# Patient Record
Sex: Male | Born: 1950 | Race: White | Hispanic: No | Smoking: Former smoker
Health system: Southern US, Community
[De-identification: ages and names within clinical notes are randomized; demographics above are authoritative.]

## PROBLEM LIST (undated history)

## (undated) DIAGNOSIS — I10 Essential (primary) hypertension: Secondary | ICD-10-CM

## (undated) DIAGNOSIS — M1990 Unspecified osteoarthritis, unspecified site: Secondary | ICD-10-CM

## (undated) DIAGNOSIS — G8929 Other chronic pain: Secondary | ICD-10-CM

## (undated) HISTORY — PX: BACK SURGERY: SHX140

## (undated) HISTORY — PX: OTHER SURGICAL HISTORY: SHX169

---

## 2004-06-15 ENCOUNTER — Other Ambulatory Visit: Payer: Self-pay

## 2004-06-25 ENCOUNTER — Ambulatory Visit: Payer: Self-pay | Admitting: Psychiatry

## 2005-07-17 ENCOUNTER — Ambulatory Visit: Payer: Self-pay

## 2005-07-17 ENCOUNTER — Emergency Department: Payer: Self-pay | Admitting: Emergency Medicine

## 2005-08-25 ENCOUNTER — Ambulatory Visit: Payer: Self-pay | Admitting: Family Medicine

## 2005-09-01 ENCOUNTER — Other Ambulatory Visit: Payer: Self-pay

## 2005-09-01 ENCOUNTER — Ambulatory Visit: Payer: Self-pay | Admitting: Internal Medicine

## 2005-09-22 ENCOUNTER — Encounter: Payer: Self-pay | Admitting: Internal Medicine

## 2005-09-25 ENCOUNTER — Encounter: Payer: Self-pay | Admitting: Internal Medicine

## 2005-10-26 ENCOUNTER — Encounter: Payer: Self-pay | Admitting: Internal Medicine

## 2005-11-23 ENCOUNTER — Encounter: Payer: Self-pay | Admitting: Internal Medicine

## 2005-12-18 IMAGING — CT CT CHEST W/ CM
1 series · 15 of 33 positions shown, 19 images · IV contrast (APPLIED)
Comparison: none

REASON FOR EXAM: Chest pain, diabetic, not Metformin  Call Report [id number]
COMMENTS:

[Series 6: soft tissue · axial · 0.79mm/px · z∈[-337,-61]mm · 15 of 108 slices shown, 19 images]
[im 8/108  mediastinal]
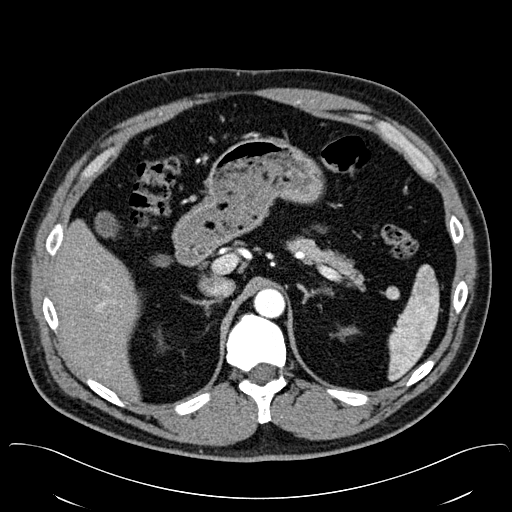
[im 8/108  lung]
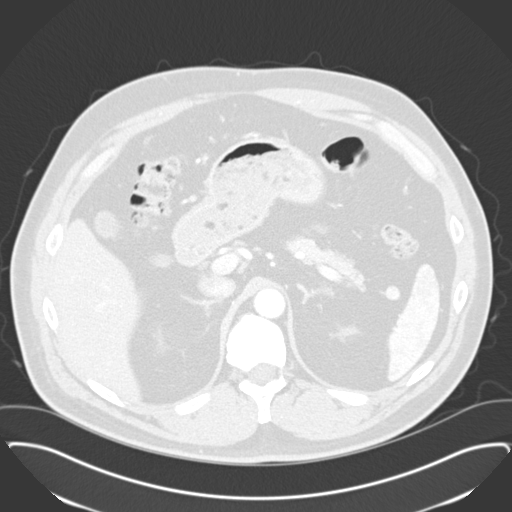
[im 16/108  lung]
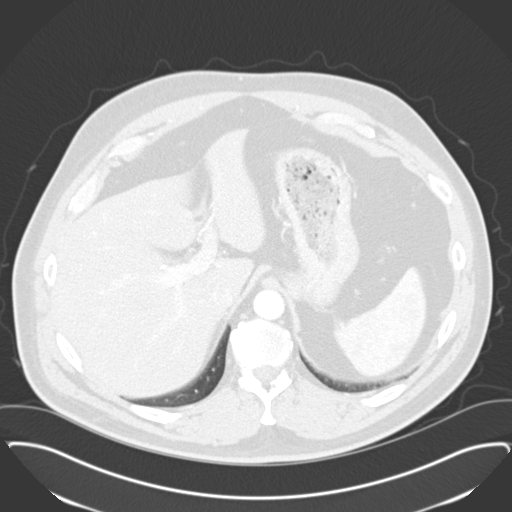
[im 22/108  lung]
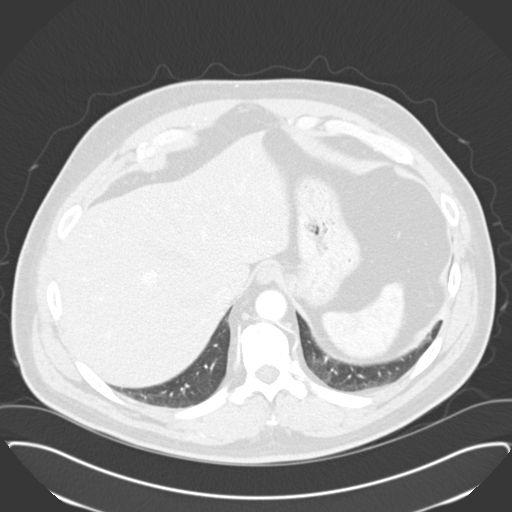
[im 28/108  lung]
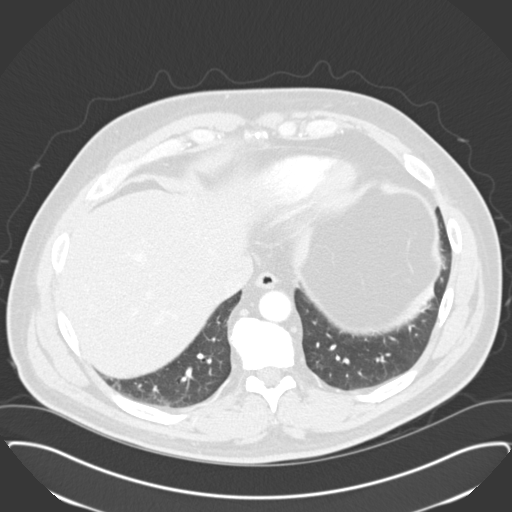
[im 36/108  mediastinal]
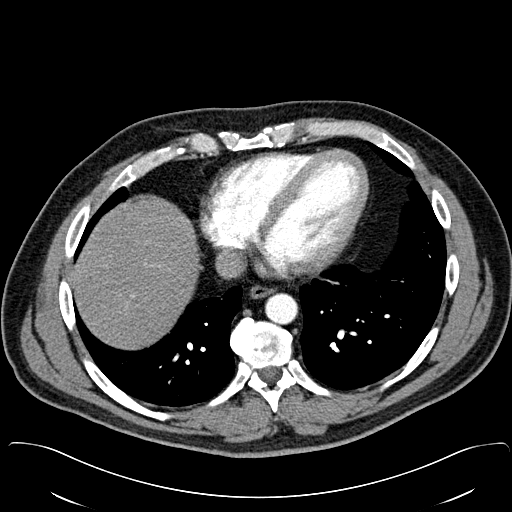
[im 36/108  lung]
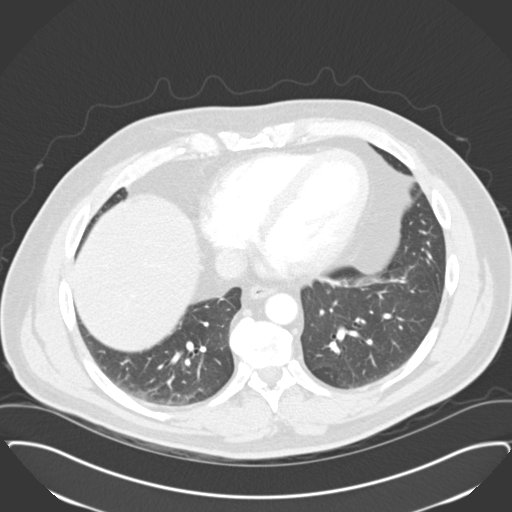
[im 43/108  lung]
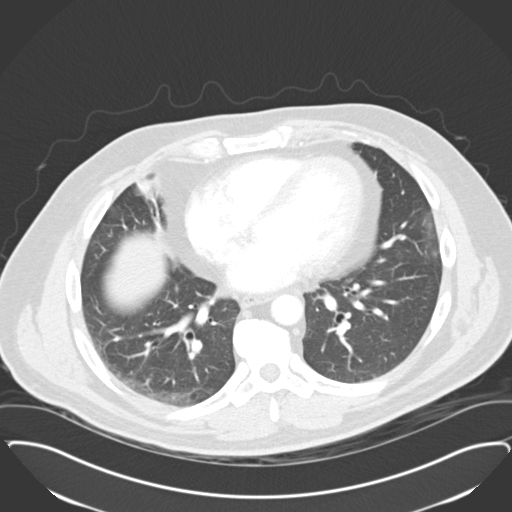
[im 48/108  lung]
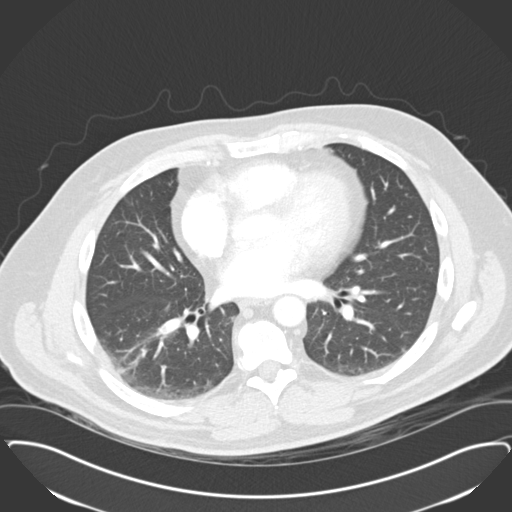
[im 56/108  lung]
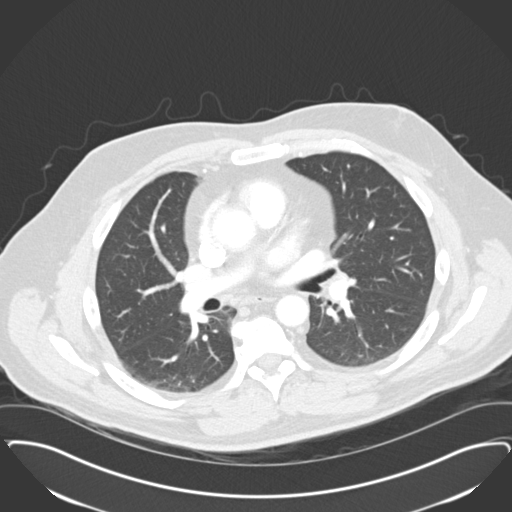
[im 60/108  mediastinal]
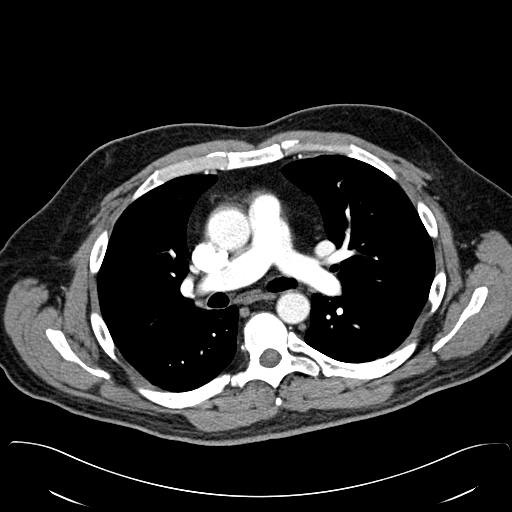
[im 60/108  lung]
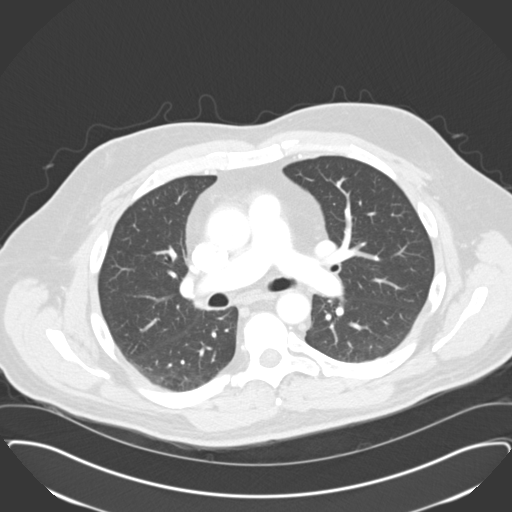
[im 65/108  lung]
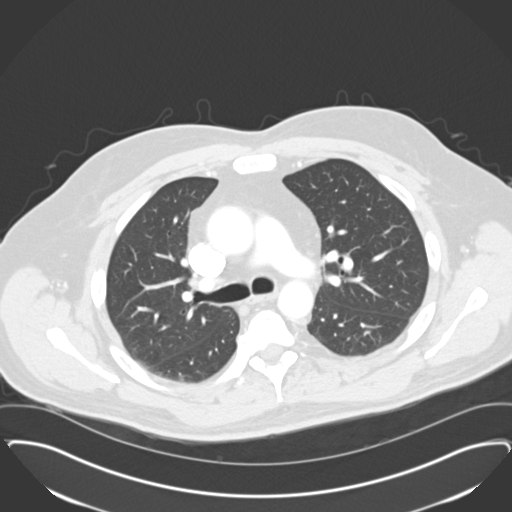
[im 72/108  lung]
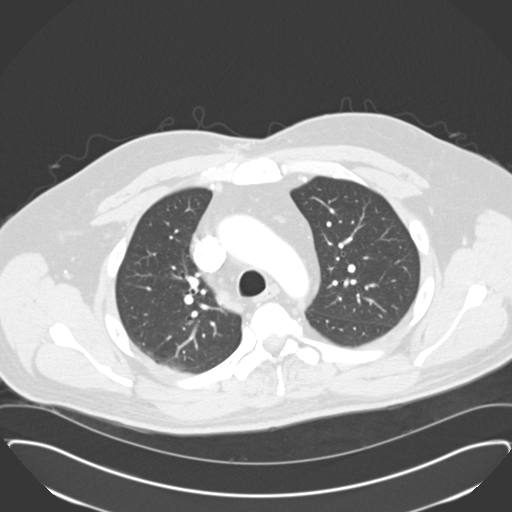
[im 80/108  lung]
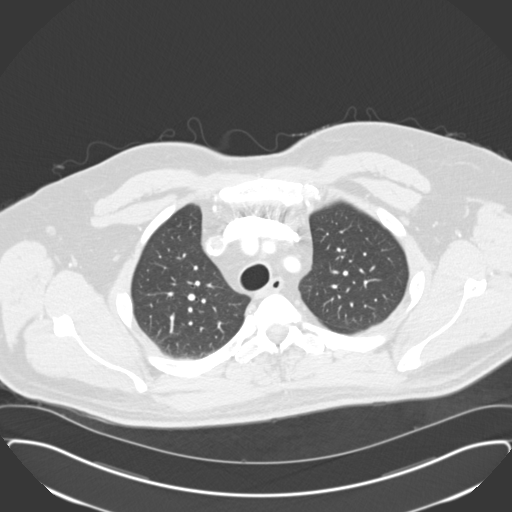
[im 86/108  mediastinal]
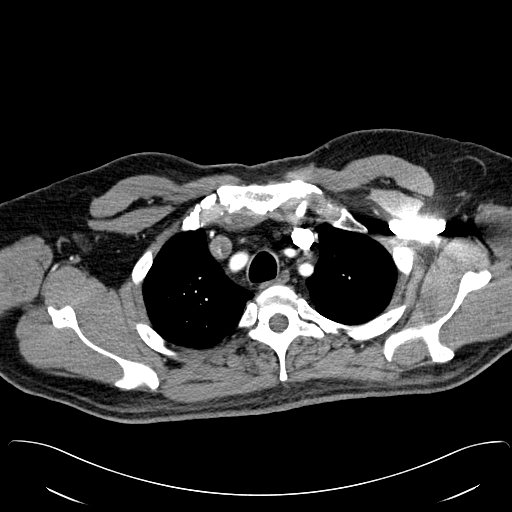
[im 86/108  lung]
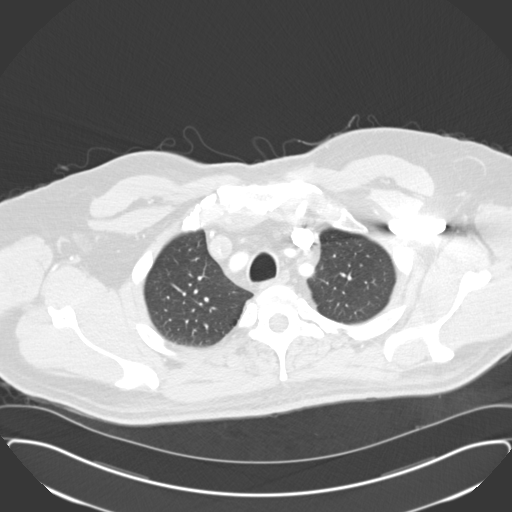
[im 92/108  lung]
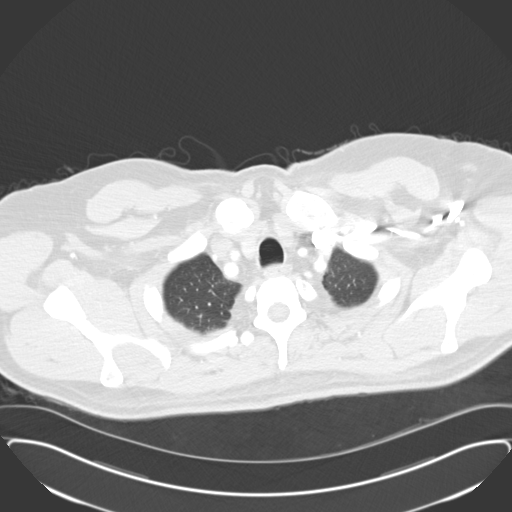
[im 100/108  lung]
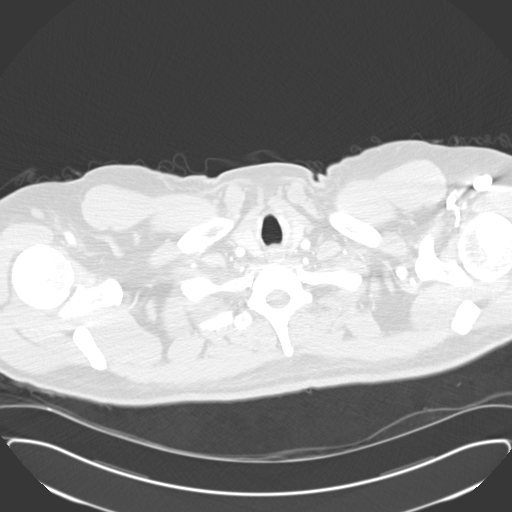

[15 of 33 positions shown; findings below may reference images not displayed]

PROCEDURE:     CT  - CT CHEST WITH CONTRAST  - August 25, 2005  [DATE]

RESULT:          Standard IV contrast-enhanced chest CT is obtained.

The mediastinum is unremarkable.  The thoracic aorta is unremarkable.  The
pulmonary arteries are clear with no evidence of pulmonary embolus.  The
adrenals are normal.  The large airways are patent.  Pleural parenchymal
thickening is noted.  No focal pulmonary abnormalities are identified.  No
evidence of infiltrate is noted.  Minimal interstitial prominence and
atelectasis is noted.  Noted is what appears to be a very tiny filling
defect in a segmental branch of the pulmonary artery in the RIGHT lower
lobe.  This region was reviewed with multiplanar reconstructions in both the
sagittal and coronal planes as well as volume-rendered images, and this
abnormality is revealed to represent volume averaging with adjacent lung.
The images just superior and inferior to this finding are normal, indicating
that, along with the normal multiplanar reconstructions and volume-rendered
images, this finding is not a pulmonary embolus.  It may be wise to repeat a
CT of the chest if the patient has persistent symptoms of pulmonary embolic
disease or has deep venous thrombus.
IMPRESSION: Mild basilar atelectasis and interstitial prominence,
otherwise negative exam.  See discussion above.

## 2008-02-10 ENCOUNTER — Emergency Department: Payer: Self-pay | Admitting: Internal Medicine

## 2008-05-05 ENCOUNTER — Other Ambulatory Visit: Payer: Self-pay

## 2008-05-05 ENCOUNTER — Ambulatory Visit: Payer: Self-pay | Admitting: Unknown Physician Specialty

## 2008-05-25 ENCOUNTER — Inpatient Hospital Stay: Payer: Self-pay | Admitting: Unknown Physician Specialty

## 2008-05-27 ENCOUNTER — Other Ambulatory Visit: Payer: Self-pay

## 2008-07-17 ENCOUNTER — Emergency Department: Payer: Self-pay | Admitting: Emergency Medicine

## 2008-12-01 ENCOUNTER — Emergency Department: Payer: Self-pay | Admitting: Emergency Medicine

## 2010-01-29 ENCOUNTER — Emergency Department: Payer: Self-pay | Admitting: Emergency Medicine

## 2010-09-12 ENCOUNTER — Ambulatory Visit: Payer: Self-pay | Admitting: Pain Medicine

## 2012-05-05 ENCOUNTER — Emergency Department: Payer: Self-pay | Admitting: Emergency Medicine

## 2012-05-05 LAB — COMPREHENSIVE METABOLIC PANEL
Alkaline Phosphatase: 118 U/L (ref 50–136)
Anion Gap: 9 (ref 7–16)
BUN: 21 mg/dL — ABNORMAL HIGH (ref 7–18)
Bilirubin,Total: 0.6 mg/dL (ref 0.2–1.0)
Calcium, Total: 9.8 mg/dL (ref 8.5–10.1)
Chloride: 100 mmol/L (ref 98–107)
Co2: 27 mmol/L (ref 21–32)
Creatinine: 1.09 mg/dL (ref 0.60–1.30)
EGFR (African American): >60
EGFR (Non-African Amer.): >60
Osmolality: 274 (ref 275–301)
Potassium: 3.8 mmol/L (ref 3.5–5.1)
SGPT (ALT): 20 U/L (ref 12–78)

## 2012-05-05 LAB — ETHANOL
Ethanol %: <0.003 % (ref 0.000–0.080)
Ethanol: <3 mg/dL

## 2012-05-05 LAB — URINALYSIS, COMPLETE
Bilirubin,UR: NEGATIVE
Blood: NEGATIVE
Hyaline Cast: 8
Leukocyte Esterase: NEGATIVE
Ph: 5 (ref 4.5–8.0)
RBC,UR: 1 /HPF (ref 0–5)
Specific Gravity: 1.02 (ref 1.003–1.030)
Squamous Epithelial: NONE SEEN

## 2012-05-05 LAB — DRUG SCREEN, URINE
Amphetamines, Ur Screen: NEGATIVE (ref ?–1000)
Barbiturates, Ur Screen: NEGATIVE (ref ?–200)
Benzodiazepine, Ur Scrn: NEGATIVE (ref ?–200)
Cannabinoid 50 Ng, Ur ~~LOC~~: NEGATIVE (ref ?–50)
Cocaine Metabolite,Ur ~~LOC~~: NEGATIVE (ref ?–300)
Opiate, Ur Screen: NEGATIVE (ref ?–300)
Phencyclidine (PCP) Ur S: NEGATIVE (ref ?–25)

## 2012-05-05 LAB — CBC
HGB: 15.3 g/dL (ref 13.0–18.0)
MCHC: 33.1 g/dL (ref 32.0–36.0)
RDW: 14.7 % — ABNORMAL HIGH (ref 11.5–14.5)
WBC: 11.8 10*3/uL — ABNORMAL HIGH (ref 3.8–10.6)

## 2012-05-05 LAB — TSH: Thyroid Stimulating Horm: 0.672 u[IU]/mL

## 2012-05-07 ENCOUNTER — Emergency Department: Payer: Self-pay | Admitting: Emergency Medicine

## 2012-05-07 LAB — COMPREHENSIVE METABOLIC PANEL
Albumin: 4 g/dL (ref 3.4–5.0)
Alkaline Phosphatase: 122 U/L (ref 50–136)
Anion Gap: 9 (ref 7–16)
BUN: 28 mg/dL — ABNORMAL HIGH (ref 7–18)
Calcium, Total: 9.2 mg/dL (ref 8.5–10.1)
Co2: 29 mmol/L (ref 21–32)
EGFR (African American): >60
EGFR (Non-African Amer.): 57 — ABNORMAL LOW
SGOT(AST): 56 U/L — ABNORMAL HIGH (ref 15–37)
SGPT (ALT): 20 U/L (ref 12–78)
Sodium: 136 mmol/L (ref 136–145)
Total Protein: 8.8 g/dL — ABNORMAL HIGH (ref 6.4–8.2)

## 2012-05-07 LAB — URINALYSIS, COMPLETE
Glucose,UR: 150 mg/dL (ref 0–75)
Hyaline Cast: 3
Nitrite: NEGATIVE
Ph: 5 (ref 4.5–8.0)
Protein: NEGATIVE
Specific Gravity: 1.012 (ref 1.003–1.030)

## 2012-05-07 LAB — ETHANOL
Ethanol %: <0.003 % (ref 0.000–0.080)
Ethanol: <3 mg/dL

## 2012-05-07 LAB — CBC
MCH: 29.6 pg (ref 26.0–34.0)
MCHC: 33.6 g/dL (ref 32.0–36.0)
MCV: 88 fL (ref 80–100)
Platelet: 413 10*3/uL (ref 150–440)
RBC: 5.06 10*6/uL (ref 4.40–5.90)
RDW: 15 % — ABNORMAL HIGH (ref 11.5–14.5)

## 2012-05-07 LAB — DRUG SCREEN, URINE
Amphetamines, Ur Screen: NEGATIVE (ref ?–1000)
Benzodiazepine, Ur Scrn: NEGATIVE (ref ?–200)
Cocaine Metabolite,Ur ~~LOC~~: NEGATIVE (ref ?–300)
MDMA (Ecstasy)Ur Screen: NEGATIVE (ref ?–500)
Methadone, Ur Screen: NEGATIVE (ref ?–300)
Opiate, Ur Screen: NEGATIVE (ref ?–300)
Tricyclic, Ur Screen: NEGATIVE (ref ?–1000)

## 2012-05-07 LAB — ACETAMINOPHEN LEVEL: Acetaminophen: <2 ug/mL

## 2012-05-07 LAB — SALICYLATE LEVEL: Salicylates, Serum: <1.7 mg/dL

## 2012-05-10 LAB — HEPATIC FUNCTION PANEL A (ARMC)
Bilirubin,Total: 0.5 mg/dL (ref 0.2–1.0)
SGPT (ALT): 15 U/L (ref 12–78)
Total Protein: 7.2 g/dL (ref 6.4–8.2)

## 2012-05-10 LAB — AMMONIA: Ammonia, Plasma: 42 mcmol/L — ABNORMAL HIGH (ref 11–32)

## 2012-05-11 ENCOUNTER — Emergency Department: Payer: Self-pay | Admitting: Internal Medicine

## 2012-05-11 LAB — URINALYSIS, COMPLETE
Nitrite: NEGATIVE
Ph: 7 (ref 4.5–8.0)
Protein: NEGATIVE
Specific Gravity: 1.016 (ref 1.003–1.030)

## 2012-05-13 ENCOUNTER — Emergency Department: Payer: Self-pay | Admitting: Emergency Medicine

## 2012-05-13 LAB — COMPREHENSIVE METABOLIC PANEL
Albumin: 3.5 g/dL (ref 3.4–5.0)
Calcium, Total: 9.1 mg/dL (ref 8.5–10.1)
Chloride: 101 mmol/L (ref 98–107)
Creatinine: 1.01 mg/dL (ref 0.60–1.30)
Glucose: 102 mg/dL — ABNORMAL HIGH (ref 65–99)
Potassium: 3.7 mmol/L (ref 3.5–5.1)
SGOT(AST): 46 U/L — ABNORMAL HIGH (ref 15–37)
SGPT (ALT): 20 U/L (ref 12–78)

## 2012-05-13 LAB — URINE CULTURE

## 2012-05-13 LAB — CBC
MCHC: 33.1 g/dL (ref 32.0–36.0)
MCV: 88 fL (ref 80–100)
Platelet: 361 10*3/uL (ref 150–440)
RDW: 14.6 % — ABNORMAL HIGH (ref 11.5–14.5)
WBC: 10.7 10*3/uL — ABNORMAL HIGH (ref 3.8–10.6)

## 2012-05-13 LAB — TSH: Thyroid Stimulating Horm: 1.98 u[IU]/mL

## 2012-05-13 LAB — ETHANOL
Ethanol %: <0.003 % (ref 0.000–0.080)
Ethanol: <3 mg/dL

## 2012-05-14 LAB — DRUG SCREEN, URINE
Cocaine Metabolite,Ur ~~LOC~~: NEGATIVE (ref ?–300)
MDMA (Ecstasy)Ur Screen: NEGATIVE (ref ?–500)
Opiate, Ur Screen: NEGATIVE (ref ?–300)
Phencyclidine (PCP) Ur S: NEGATIVE (ref ?–25)
Tricyclic, Ur Screen: NEGATIVE (ref ?–1000)

## 2012-05-14 LAB — URINALYSIS, COMPLETE
Glucose,UR: NEGATIVE mg/dL (ref 0–75)
Nitrite: NEGATIVE
Protein: NEGATIVE
Specific Gravity: 1.006 (ref 1.003–1.030)

## 2012-05-14 LAB — AMMONIA: Ammonia, Plasma: <25 mcmol/L (ref 11–32)

## 2012-11-05 ENCOUNTER — Emergency Department: Payer: Self-pay | Admitting: Unknown Physician Specialty

## 2012-11-05 LAB — CBC
HGB: 14.3 g/dL (ref 13.0–18.0)
MCH: 29.7 pg (ref 26.0–34.0)
MCHC: 33.3 g/dL (ref 32.0–36.0)
MCV: 89 fL (ref 80–100)
RDW: 14.4 % (ref 11.5–14.5)
WBC: 12.1 10*3/uL — ABNORMAL HIGH (ref 3.8–10.6)

## 2012-11-05 LAB — URINALYSIS, COMPLETE
Glucose,UR: 150 mg/dL (ref 0–75)
Ketone: NEGATIVE
Leukocyte Esterase: NEGATIVE
Nitrite: NEGATIVE
Ph: 5 (ref 4.5–8.0)
RBC,UR: 4 /HPF (ref 0–5)
Specific Gravity: 1.023 (ref 1.003–1.030)
WBC UR: 2 /HPF (ref 0–5)

## 2012-11-05 LAB — COMPREHENSIVE METABOLIC PANEL
Alkaline Phosphatase: 127 U/L (ref 50–136)
Anion Gap: 7 (ref 7–16)
BUN: 24 mg/dL — ABNORMAL HIGH (ref 7–18)
Bilirubin,Total: 0.5 mg/dL (ref 0.2–1.0)
Calcium, Total: 9.4 mg/dL (ref 8.5–10.1)
Chloride: 100 mmol/L (ref 98–107)
Co2: 28 mmol/L (ref 21–32)
EGFR (African American): 57 — ABNORMAL LOW
EGFR (Non-African Amer.): 49 — ABNORMAL LOW
SGOT(AST): 20 U/L (ref 15–37)
Total Protein: 8.4 g/dL — ABNORMAL HIGH (ref 6.4–8.2)

## 2012-11-09 LAB — ETHANOL
Ethanol %: <0.003 % (ref 0.000–0.080)
Ethanol: <3 mg/dL

## 2012-11-09 LAB — CBC
HGB: 14.9 g/dL (ref 13.0–18.0)
MCH: 29.7 pg (ref 26.0–34.0)
Platelet: 463 10*3/uL — ABNORMAL HIGH (ref 150–440)
RBC: 5.03 10*6/uL (ref 4.40–5.90)

## 2012-11-09 LAB — DRUG SCREEN, URINE
Barbiturates, Ur Screen: NEGATIVE (ref ?–200)
Benzodiazepine, Ur Scrn: NEGATIVE (ref ?–200)
Cannabinoid 50 Ng, Ur ~~LOC~~: NEGATIVE (ref ?–50)
Cocaine Metabolite,Ur ~~LOC~~: NEGATIVE (ref ?–300)
Methadone, Ur Screen: NEGATIVE (ref ?–300)
Opiate, Ur Screen: NEGATIVE (ref ?–300)
Phencyclidine (PCP) Ur S: NEGATIVE (ref ?–25)
Tricyclic, Ur Screen: POSITIVE (ref ?–1000)

## 2012-11-09 LAB — COMPREHENSIVE METABOLIC PANEL
Anion Gap: 10 (ref 7–16)
BUN: 14 mg/dL (ref 7–18)
Calcium, Total: 9.6 mg/dL (ref 8.5–10.1)
Chloride: 97 mmol/L — ABNORMAL LOW (ref 98–107)
Co2: 26 mmol/L (ref 21–32)
Creatinine: 1.11 mg/dL (ref 0.60–1.30)
EGFR (African American): >60
Osmolality: 269 (ref 275–301)
SGOT(AST): 23 U/L (ref 15–37)
SGPT (ALT): 17 U/L (ref 12–78)

## 2012-11-09 LAB — URINALYSIS, COMPLETE
Bacteria: NONE SEEN
Bilirubin,UR: NEGATIVE
Blood: NEGATIVE
Glucose,UR: NEGATIVE mg/dL (ref 0–75)
Protein: NEGATIVE
RBC,UR: NONE SEEN /HPF (ref 0–5)
WBC UR: 2 /HPF (ref 0–5)

## 2012-11-09 LAB — SALICYLATE LEVEL: Salicylates, Serum: 1.8 mg/dL

## 2012-11-10 ENCOUNTER — Inpatient Hospital Stay: Payer: Self-pay | Admitting: Psychiatry

## 2012-11-16 LAB — HEMOGLOBIN: HGB: 12.7 g/dL — ABNORMAL LOW (ref 13.0–18.0)

## 2012-11-17 LAB — CBC WITH DIFFERENTIAL/PLATELET
Basophil %: 0.3 %
Eosinophil #: 0.4 10*3/uL (ref 0.0–0.7)
HGB: 12.4 g/dL — ABNORMAL LOW (ref 13.0–18.0)
Lymphocyte #: 2.1 10*3/uL (ref 1.0–3.6)
Lymphocyte %: 23 %
MCH: 28.9 pg (ref 26.0–34.0)
MCHC: 32.3 g/dL (ref 32.0–36.0)
Monocyte %: 8 %
Neutrophil #: 5.8 10*3/uL (ref 1.4–6.5)
Neutrophil %: 64.1 %
RBC: 4.3 10*6/uL — ABNORMAL LOW (ref 4.40–5.90)
RDW: 14.7 % — ABNORMAL HIGH (ref 11.5–14.5)
WBC: 9.1 10*3/uL (ref 3.8–10.6)

## 2012-11-17 LAB — BASIC METABOLIC PANEL
Anion Gap: 7 (ref 7–16)
BUN: 16 mg/dL (ref 7–18)
Calcium, Total: 9.1 mg/dL (ref 8.5–10.1)
Chloride: 105 mmol/L (ref 98–107)
Co2: 26 mmol/L (ref 21–32)
EGFR (Non-African Amer.): >60
Sodium: 138 mmol/L (ref 136–145)

## 2012-11-17 LAB — HEMOGLOBIN A1C: Hemoglobin A1C: 8.5 % — ABNORMAL HIGH (ref 4.2–6.3)

## 2012-11-17 LAB — SEDIMENTATION RATE: Erythrocyte Sed Rate: 11 mm/hr (ref 0–20)

## 2012-11-18 LAB — LITHIUM LEVEL: Lithium: 1.2 mmol/L

## 2012-11-18 LAB — HEMOGLOBIN: HGB: 11.7 g/dL — ABNORMAL LOW (ref 13.0–18.0)

## 2012-11-19 LAB — HEMOGLOBIN: HGB: 12.7 g/dL — ABNORMAL LOW (ref 13.0–18.0)

## 2012-11-21 LAB — PATHOLOGY REPORT

## 2013-02-08 ENCOUNTER — Emergency Department: Payer: Self-pay | Admitting: Emergency Medicine

## 2013-02-09 ENCOUNTER — Emergency Department: Payer: Self-pay | Admitting: Emergency Medicine

## 2013-02-09 ENCOUNTER — Emergency Department: Payer: Self-pay | Admitting: Internal Medicine

## 2013-02-09 LAB — CBC
HGB: 13 g/dL (ref 13.0–18.0)
MCH: 28.7 pg (ref 26.0–34.0)
Platelet: 388 10*3/uL (ref 150–440)
RBC: 4.54 10*6/uL (ref 4.40–5.90)
RDW: 14.9 % — ABNORMAL HIGH (ref 11.5–14.5)

## 2013-02-09 LAB — BASIC METABOLIC PANEL
EGFR (Non-African Amer.): >60
Potassium: 4.5 mmol/L (ref 3.5–5.1)
Sodium: 135 mmol/L — ABNORMAL LOW (ref 136–145)

## 2013-02-13 ENCOUNTER — Emergency Department: Payer: Self-pay | Admitting: Emergency Medicine

## 2013-02-13 LAB — URINALYSIS, COMPLETE
Bilirubin,UR: NEGATIVE
Blood: NEGATIVE
Ph: 6 (ref 4.5–8.0)
Protein: NEGATIVE
Specific Gravity: 1.012 (ref 1.003–1.030)
Squamous Epithelial: NONE SEEN

## 2013-02-13 LAB — COMPREHENSIVE METABOLIC PANEL
Albumin: 4.1 g/dL (ref 3.4–5.0)
Alkaline Phosphatase: 141 U/L — ABNORMAL HIGH (ref 50–136)
BUN: 16 mg/dL (ref 7–18)
Bilirubin,Total: 0.3 mg/dL (ref 0.2–1.0)
Co2: 26 mmol/L (ref 21–32)
EGFR (Non-African Amer.): >60
Glucose: 176 mg/dL — ABNORMAL HIGH (ref 65–99)
Osmolality: 272 (ref 275–301)
Potassium: 4.1 mmol/L (ref 3.5–5.1)
SGPT (ALT): 23 U/L (ref 12–78)
Sodium: 133 mmol/L — ABNORMAL LOW (ref 136–145)

## 2013-02-13 LAB — CBC
HCT: 43.6 % (ref 40.0–52.0)
MCHC: 33 g/dL (ref 32.0–36.0)
MCV: 88 fL (ref 80–100)
Platelet: 460 10*3/uL — ABNORMAL HIGH (ref 150–440)
RBC: 4.97 10*6/uL (ref 4.40–5.90)
RDW: 14.6 % — ABNORMAL HIGH (ref 11.5–14.5)
WBC: 10.1 10*3/uL (ref 3.8–10.6)

## 2013-02-13 LAB — TSH: Thyroid Stimulating Horm: 0.946 u[IU]/mL

## 2013-02-13 LAB — DRUG SCREEN, URINE
Amphetamines, Ur Screen: NEGATIVE (ref ?–1000)
Cannabinoid 50 Ng, Ur ~~LOC~~: NEGATIVE (ref ?–50)
Cocaine Metabolite,Ur ~~LOC~~: NEGATIVE (ref ?–300)
Opiate, Ur Screen: POSITIVE (ref ?–300)

## 2013-02-17 ENCOUNTER — Emergency Department: Payer: Self-pay | Admitting: Emergency Medicine

## 2013-02-17 LAB — BASIC METABOLIC PANEL
Anion Gap: 5 — ABNORMAL LOW (ref 7–16)
Chloride: 103 mmol/L (ref 98–107)
Creatinine: 1.13 mg/dL (ref 0.60–1.30)
EGFR (African American): >60
EGFR (Non-African Amer.): >60
Glucose: 209 mg/dL — ABNORMAL HIGH (ref 65–99)
Sodium: 135 mmol/L — ABNORMAL LOW (ref 136–145)

## 2013-02-17 LAB — CBC
HGB: 12.5 g/dL — ABNORMAL LOW (ref 13.0–18.0)
MCH: 29.1 pg (ref 26.0–34.0)
MCV: 87 fL (ref 80–100)
Platelet: 413 10*3/uL (ref 150–440)
RBC: 4.31 10*6/uL — ABNORMAL LOW (ref 4.40–5.90)
RDW: 14.6 % — ABNORMAL HIGH (ref 11.5–14.5)

## 2013-02-17 LAB — TROPONIN I: Troponin-I: <0.02 ng/mL

## 2013-02-17 LAB — CK TOTAL AND CKMB (NOT AT ARMC)
CK, Total: 834 U/L — ABNORMAL HIGH (ref 35–232)
CK-MB: 15 ng/mL — ABNORMAL HIGH (ref 0.5–3.6)

## 2013-02-17 LAB — PROTIME-INR: Prothrombin Time: 13.3 secs (ref 11.5–14.7)

## 2013-03-11 ENCOUNTER — Emergency Department: Payer: Self-pay | Admitting: Emergency Medicine

## 2013-04-18 ENCOUNTER — Emergency Department: Payer: Self-pay | Admitting: Emergency Medicine

## 2013-04-18 LAB — BASIC METABOLIC PANEL
BUN: 21 mg/dL — ABNORMAL HIGH (ref 7–18)
Chloride: 104 mmol/L (ref 98–107)
Co2: 28 mmol/L (ref 21–32)
Creatinine: 1.13 mg/dL (ref 0.60–1.30)
Osmolality: 278 (ref 275–301)
Potassium: 3.9 mmol/L (ref 3.5–5.1)
Sodium: 137 mmol/L (ref 136–145)

## 2013-04-18 LAB — CBC
HCT: 40.2 % (ref 40.0–52.0)
HGB: 13.3 g/dL (ref 13.0–18.0)
MCH: 28.4 pg (ref 26.0–34.0)
MCHC: 33 g/dL (ref 32.0–36.0)
MCV: 86 fL (ref 80–100)
Platelet: 363 10*3/uL (ref 150–440)
RBC: 4.67 10*6/uL (ref 4.40–5.90)

## 2013-04-18 LAB — CK TOTAL AND CKMB (NOT AT ARMC): CK-MB: 13 ng/mL — ABNORMAL HIGH (ref 0.5–3.6)

## 2013-04-18 LAB — TROPONIN I: Troponin-I: <0.02 ng/mL

## 2014-01-24 ENCOUNTER — Emergency Department: Payer: Self-pay | Admitting: Emergency Medicine

## 2014-01-24 LAB — BASIC METABOLIC PANEL
Anion Gap: 5 — ABNORMAL LOW (ref 7–16)
BUN: 15 mg/dL (ref 7–18)
CALCIUM: 9.3 mg/dL (ref 8.5–10.1)
CHLORIDE: 101 mmol/L (ref 98–107)
CREATININE: 1.12 mg/dL (ref 0.60–1.30)
Co2: 30 mmol/L (ref 21–32)
EGFR (African American): >60
GLUCOSE: 249 mg/dL — AB (ref 65–99)
Osmolality: 281 (ref 275–301)
POTASSIUM: 4.2 mmol/L (ref 3.5–5.1)
SODIUM: 136 mmol/L (ref 136–145)

## 2014-01-24 LAB — CBC
HCT: 46.4 % (ref 40.0–52.0)
HGB: 14.7 g/dL (ref 13.0–18.0)
MCH: 28.2 pg (ref 26.0–34.0)
MCHC: 31.7 g/dL — ABNORMAL LOW (ref 32.0–36.0)
MCV: 89 fL (ref 80–100)
Platelet: 376 10*3/uL (ref 150–440)
RBC: 5.22 10*6/uL (ref 4.40–5.90)
RDW: 14.9 % — AB (ref 11.5–14.5)
WBC: 16.8 10*3/uL — ABNORMAL HIGH (ref 3.8–10.6)

## 2015-01-12 NOTE — Consult Note (Signed)
PATIENT NAME:  Jeffery Beck, Jeffery Beck MR#:  [id number] DATE OF BIRTH:  21-Aug-1951  DATE OF CONSULTATION:  05/07/2012  REFERRING PHYSICIAN:  Dr. Gaetano NetKevin Boland  CONSULTING PHYSICIAN:  Reymond Maynez B. Jazelyn Sipe, MD  REASON FOR CONSULTATION: To evaluate patient with altered mental status.   IDENTIFYING DATA: Jeffery Beck is a 64 year old male with no past psychiatric history.   CHIEF COMPLAINT: "[facility]. [facility] and [facility]."  HISTORY OF PRESENT ILLNESS: The patient is unable to provide in history. He is psychotic, delusional, paranoid and disorganized. He is loud and agitated one minute and extremely tearful crying the other. Most of the information is obtained from his teenage son. The son reports that the patient does not have any psychiatric history to speak of. In the middle of last week around Wednesday the patient's behavior suddenly changed. He became very emotional. He was loud, insomniac with pressured speech, irritable at times. He was crying a lot. His moods were changing from minute to minute. His behavior became erratic. He tried to cook potatoes that were already cooked and burned them. He was making no sense. On Sunday he went to church and most likely was behaving strangely as he was sent to the Emergency Room on Sunday. He arrived here and complained of urinary retention and a catheter was placed. It is unclear to me today whether the patient was delusional at that time. Today he complains of gravity not moving in a circular motion affecting his bowel movement. He says that he has been constipated for a week. He makes it clear that there is no effect of this lack of circular motion on his bladder but is preoccupied with his bowel movements. He reports that ordinarily he has two bowel movements a day which is quite likely as he has Crohn's but has not been to the bathroom for a week. He was discharged to home from the Emergency Room on Sunday. He returns here today on Tuesday floridly  psychotic, making absolutely no sense, speaking in [place], [place] and [place] at the same time, unable to provide any sensible information, interacting his son on numerous occasions. He is loud and extremely tearful again. The son denies that there is any history of alcohol, illicit drugs or prescription pill abuse. He has never seen his father acting like this before.   PAST PSYCHIATRIC HISTORY: When pressed the son admits that possibly could have been one psychiatric hospitalization many years ago, maybe even when his father was in the [employer]. From the list of his medication I see that he is taking Remeron and Effexor. This is prescribed by his primary provider.   FAMILY PSYCHIATRIC HISTORY: The son makes a statement that he is a little bipolar but has never been diagnosed. There is history of mental illness in the family but he is uncertain about diagnosis. He denies any completed suicides in the family.   PAST MEDICAL HISTORY:  1. Urinary tract infection. The patient has indwelling catheter.  2. Hypertension.  3. Diabetes, on insulin. 4. Inflammatory bowel disease. 5. Prostate hypertrophy. 6. Chronic pain.   ALLERGIES: No known drug allergies.   MEDICATIONS ON ADMISSION:  1. Cipro 250 mg every 12 hours. 2. Hydrochlorothiazide 12.5 mg daily.  3. NovoLog 11 units before each meal. 4. Lantus 70 units at bedtime.  5. Lisinopril 10 mg daily.  6. Mesalamine 1000 mg rectal suppository twice daily.  7. Mesalamine 400 mg 3 times daily.  8. Remeron 15 mg at  bedtime. 9. Metformin 1000 mg twice daily.  10. Simvastatin 40 mg at bedtime.  11. Terazosin 5 mg at bedtime. 12. Venlafaxine 150 mg daily. 13. Orphenadrine 100 mg twice daily. 14. Pregabalin 75 mg twice daily.  15. Tramadol 50 mg twice daily up to four times daily.  16. Celebrex 100 mg daily.   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 He also was doing something with  pianos. He is retired now. Somehow he had difficulty obtaining retirement money and was just approved for it. I wonder if it is disability. He, however, has not received any payments yet. His financial situation is difficult. He has VA benefits. He was drafted at the time of [place] war. He did not to serve in combat but has been all over world. He lives with his 76 year old father, two sisters and his young son. He has a girlfriend who is in [place]    REVIEW OF SYSTEMS: Very difficult to obtain. The patient denies any problems or symptoms except for urinary retention for which he was catheterized and constipation.   PHYSICAL EXAMINATION:  VITAL SIGNS: Blood pressure 148/93, pulse 116, respirations 20, temperature 96.9.   GENERAL: This is a well-developed male slightly agitated and crying out loud. The rest of the physical examination is deferred to his primary attending.   LABORATORY, DIAGNOSTIC AND RADIOLOGICAL DATA: Chemistries are within normal limits except for BUN of 28, creatinine 1.33, potassium 3.4. Blood alcohol level zero. LFTs within normal limits except for AST 56. TSH 1.87. Urine tox screen is negative for substances. CBC within normal limits except for white blood count of 11.1. Urinalysis is suggestive of urinary tract infection. Urine culture pending. Serum acetaminophen and salicylates are low.   MENTAL STATUS EXAMINATION: The patient is alert. He is oriented to person, place, time, and somewhat situation. He is very pleasant but extremely emotionally labile, cries at the drop of a hat especially when talking about his brother who apparently passed away. He maintains good eye contact. He is wearing hospital scrubs. He is unshaved and slightly unkempt. His speech is pressured at times and loud, at times he whispers in confidence. His mood is "very good" with extremely labile affect. Thought processing is disorganized. Thought content: He denies thoughts of hurting himself or others.  He is paranoid and delusional. He denies auditory hallucinations but appears to be attending to internal stimuli. His cognition is impaired. His insight and judgment are extremely poor.   SUICIDE RISK ASSESSMENT: This is a patient with history of depression and new onset manic episode who is too disorganized to execute or plan a suicide attempt but he needs supervision and support.   ASSESSMENT:  AXIS I:  1. Bipolar affective disorder, most recent episode mixed with psychosis.  2. Rule out delirium secondary to urinary tract infection.   AXIS II: Deferred.   AXIS III:  1. Diabetes.  2. Hypertension.  3. Urinary tract infection.  4. Indwelling catheter. 5. Benign prostatic hypertrophy. 6. Inflammatory bowel disease. 7. Chronic pain.   AXIS IV: New onset mental illness, physical illness.   AXIS V: Global Assessment of Functioning 30.   PLAN:  1. The patient appears to be in a mixed episode. I will start Depakote 500 mg 3 times daily for mood stabilization, Risperdal 2 mg twice daily for psychosis. Will add benzodiazepines for further mood stabilization and anxiety and temazepam for sleep.  2. The patient has  VA benefits. We contacted VA system. They would prefer to admit the patient to their own hospital. We are awaiting their decision.  3. I will follow up.  ____________________________ Braulio Conte B. Jennet Maduro, MD jbp:cms D: 05/07/2012 17:57:43 ET T: 05/08/2012 08:34:21 ET JOB#: [id number]  cc: Saiquan Hands B. Jennet Maduro, MD, <Dictator>  Shari Prows MD ELECTRONICALLY SIGNED 05/09/2012 23:04

## 2015-01-12 NOTE — Consult Note (Signed)
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   Patient was seen by consultant.   Consult note dictated.   Recommend further assessment or treatment.   Orders entered.   Discussed with Attending MD.   Comments: Jeffery Beck has a h/o depression. Since middle of last week he became manic, psychotic and disorganized. He has VA benefits. He may have a bed available there. We are awaiting their decision.   He improved tremendously on a combination of Depakote and risperdal.  MSE: Alert, oriented, pleasant, sensible, no longer emotional, no psychotic symptoms, no safety issues.   PLAN: 1. We will continue depakote for mood stabilization, risperdal for psychosis, temazepam for sleep. Will make Ativan as needed for anxiety as the patient is oversedated. VPA, Ammonia and LFTs in am.   2. We are working with the VA system to secure a bed there. If a bed is not available tomorrow, I will discharge him home as we can not admit him here w/o VA approval.  3. I will follow up.  Electronic Signatures: Jeffery Beck, Jeffery Beck (MD)  (Signed 15-Aug-13 22:18)  Authored: Brief Consult Note   Last Updated: 15-Aug-13 22:18 by Jeffery Beck, Jeffery Beck (MD)

## 2015-01-12 NOTE — Consult Note (Signed)
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   Patient was seen by consultant.   Consult note dictated.   Recommend further assessment or treatment.   Orders entered.   Discussed with Attending MD.   Comments: [name] has a h/o depression. Since middle of last week he became manic, psychotic and disorganized. He has VA benefits but a bed has not been availaable there yet.   The patient improved tremendously on a combination of Depakote and Risperdal. Depakote level is theraputic at 80, ammonia slightly elevated.  The patient has a Foley since his visit in the ER on 08/11.  MSE: Alert, oriented, pleasant, sensible, no longer emotional, no psychotic symptoms, no safety issues.   PLAN: 1. The patient no longer meets criteria for IVC. I will terminate proceedings. Please discharge as appropriate.   2. We will continue depakote for mood stabilization, risperdal for psychosis, temazepam for sleep. VPA level is therapeutic, ammonia level slightly elevated, LFTs wnl. Rx given.  3. He will follow up with his VA provider on Aug 26.  4. The patient has UTI. He will continue on Cipro. A Foley was placed during his first ER visit on 8/11. Please address this problem before discharge.  Electronic Signatures: Kristine LineaPucilowska, Dhrithi Riche (MD)  (Signed 16-Aug-13 11:58)  Authored: Brief Consult Note   Last Updated: 16-Aug-13 11:58 by Kristine LineaPucilowska, Alexys Lobello (MD)

## 2015-01-12 NOTE — Consult Note (Signed)
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   Patient was seen by consultant.   Consult note dictated.   Recommend further assessment or treatment.   Orders entered.   Discussed with Attending MD.   Comments: [name] has a h/o depression. Since middle of last week he became manic, psychotic and disorganized. He has VA benefits. He may have a bed available there. We are awaiting their decision.   PLAN: 1. We will start depakote for mood stabilization, risperdal for psychosis, temazepam for sleep, ativan for anxiety.  2. We are working with the VA system to secure a bed there.  3. I will follow up.  Electronic Signatures: Jeffery Beck, Jeffery Beck (MD)  (Signed 13-Aug-13 13:47)  Authored: Brief Consult Note   Last Updated: 13-Aug-13 13:47 by Jeffery Beck, Jeffery Beck (MD)

## 2015-01-12 NOTE — Consult Note (Signed)
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   Patient was seen by consultant.   Recommend further assessment or treatment.   Orders entered.   Discussed with Attending MD.   Comments: [name] has a h/o depression. Since beginning of August he started displaying symptoms of mania. he was kept in the ER for several days to treat mixed episode with improvement. He has VA benefits and we could not admit him here. He has not been compliant with medications following dischrage as he could not affotd it. [facility] times asking to remove [facility] on [facility]. He will [facility] appointm,ent [facility] shortly.   The patient had undetectable VPA level last evening but he does not display psychotic or disorganized behavior/thinking. He is not suicidal or homicidal. He is preoccupied with somatic symptoms and indwelling catheter as this is new to him and causes him discomfort.    MSE: Alert, oriented, pleasant, sensible, no longer emotional, no psychotic symptoms, no safety issues.   PLAN: 1. The patient was restarted on all his medications including Depakote and Risperdal. 14 day of medications was dispensed from the hospital pharmacy to bridge arrival of MEDICARE benefits.    2. 7 day supply of Cipro to treat UTI was also given.   3. He [place] [facility]. VA urogogy will call the patient at home to schedule appointments.  4. Please discharge as appropriate.  Electronic Signatures: Kristine LineaPucilowska, Felicite Zeimet (MD)  (Signed 20-Aug-13 19:37)  Authored: Brief Consult Note   Last Updated: 20-Aug-13 19:37 by Kristine LineaPucilowska, Alassane Kalafut (MD)
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
## 2015-01-15 NOTE — Consult Note (Signed)
Flex sig shows diffuse colitis at the distal rectum.  I did a short flex sig and the proximal and mid rectum were uninvolved.  Offered him choice of oral prednisone and Anusol HC supp and he chose the suppositories.  Please [facility] a [facility] for a copy of previous colonoscopy report for me to read.  Continue Asacol 800mg  po TID.  Electronic Signatures: Scot JunElliott, Robert T (MD)  (Signed on 25-Feb-14 12:40)  Authored  Last Updated: 25-Feb-14 12:40 by Scot JunElliott, Robert T (MD)

## 2015-01-15 NOTE — H&P (Signed)
PATIENT NAME:  Jeffery Beck, Jeffery Beck MR#:  [id number] DATE OF BIRTH:  [birth year]  DATE OF ADMISSION:  11/10/2012  CONSULTING PHYSICIAN: Gonzella Lex, M.D  INFORMATION AND CHIEF COMPLAINT: This is a 64 year old man with a history of recurrent episodes of depression and mania, who was brought to the hospital because of deteriorating mental state.   CHIEF COMPLAINT: "It's just been getting worse."   HISTORY OF PRESENT ILLNESS: Information obtained from the patient primarily and from his old chart. The patient reports that he has been feeling more and more depressed for the last couple of months. He indicates that he is plagued by worries about finances. Apparently he got a fairly large settlement of money several months ago when his disability was approved. Since that time he has blown through the entire amount in a variety of ways, mostly by giving it away. It sounds like he somehow got himself involved with a woman from the [place] whom he met on the internet and gave away a large amount of money to her. He also seems to have traveled to the [place] on at least one occasion, by his report, and given away some money during that time. His history is bit rambling and hard to follow. He admits that he has been feeling depressed. He says that his appetite has been very poor and he thinks that he has lost a lot of weight, although his current weight is 211 pounds. He indicates that he sleeps poorly somewhat chaotically. He worries a great deal. He admits that he has had some thoughts at times of wishing he were dead or thinking about killing himself. He has crying spells and has racing thoughts. He says the thing keeping him alive has been his worry that he has to take care of his children who are ages 71, and 38 and live with him. He has been taking care of his physical health poorly. He tells me that he has been very upset because he thinks both his son and daughter are "hooked on pot" and unable to find  jobs and unable to support themselves, which is why he thinks that he needs to be there to support them. It sounds like clearly he is not capable of taking care of anyone including his children right now. He has been taking medication prescribed by a doctor at the [facility] and says that he has been compliant with it, although he later clarified that his compliance might be intermittent because sometimes he forgets to take the morning medicine. He denies that he has been abusing any drugs or alcohol.   PAST PSYCHIATRIC HISTORY: The patient had a psychiatric hospitalization at our facility in 2005 for what sounds like a severe depression. After that, he may have had other episodes of depression. Late this summer, he was evaluated at the Emergency Room here in what appeared to be a psychotic manic state. He was not admitted to the hospital but was treated in the Emergency Room for several days and then discharged. Again, it is somewhat hard to get a clear history from him. Some of the old notes suggest that there might have been other manic or hypomanic episodes in the past as well. He had been treated in the past primarily with antidepressants, but also with antipsychotics. This summer, he was treated with Depakote and Risperdal. Currently it appears that he has been taking a combination of Remeron, nortriptyline and venlafaxine prescribed by a doctor he sees at the [place]. The patient  denies that he has ever tried to kill himself in the past. Denies any history of violence.   SUBSTANCE ABUSE HISTORY: The patient reports that he did have a problem with alcohol in the past, but has not had a problem with drinking in a long time. Currently does not drink at all. Denies any drug abuse, including denying any abuse of his medication.   PAST MEDICAL HISTORY: The patient has diabetes, requiring both oral medicine and insulin. He has high blood pressure, also has ulcerative colitis and has had several orthopedic injuries.    SOCIAL HISTORY: The patient has been married twice. He has two different cohorts of children. The older ones are in their 30s and do not live with him.  The younger ones ages, 28 and 25, currently are living with him. He is evidently getting disability, not able to work, although in the past, he worked as a Radio producer for many years.   REVIEW OF SYSTEMS:  The patient complains of depression, fatigue, poor sleep, poor appetite. Has had suicidal ideation. Racing thoughts. He denies having auditory or visual hallucinations. Denies homicidal ideation. Does have some GI upset, but has not been throwing up and has been able to eat okay. Does not report any other specific pain currently.   CURRENT MEDICATIONS: The patient was not a very good historian, so we put together the list as best we can. Appears that he was taking Celebrex 100 mg a day, Klonopin 0.5 mg 3 times a day, Flexeril 10 mg at night, hydrochlorothiazide 25 mg a day, Lantus insulin 70 units at night, lisinopril 20 mg per day, Delzicol 800 mg every evening, metformin 1000 mg twice a day, Remeron 45 mg at night, nortriptyline 150 mg at night, Hytrin 5 mg at night, Ultram 50 mg p.r.n. up to 4 times a day, venlafaxine 150 mg extended-release per day.   ALLERGIES: No known drug allergies.   MENTAL STATUS EXAM: Disheveled, poorly groomed man. He appears somewhat confused and easily distracted. Looks tired. When I first went to interview him, he told me he thought he was having low blood sugar. He also has reported that he is having episodes of orthostatic hypotension. The patient was able to give an interview, but had a hard time staying focused. His speech patterns were often rambling. Speech was not quite pressured, but he was voluble. Eye contact was good. Psychomotor activity a little fidgety. Speech was not loud. His affect was dysphoric, confused and anxious. Mood was stated as depressed. Thoughts appeared somewhat scattered, but he did not  make any grossly bizarre statements. He denied auditory or visual hallucinations. 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 Denied any current suicidal or homicidal ideation, but made some vaguely, hopeless statements and admitted that recently had suicidal thoughts. The patient has poor concentration, poor insight and judgment. Baseline intelligence at least normal.   PHYSICAL EXAMINATION: GENERAL: Disheveled, somewhat sickly-appearing man. No acute skin lesions.  HEENT: Pupils equal and reactive. Face generally symmetric. Oral health was quite poor. He had many missing teeth and several of his teeth look like they were having rot at the  gum line.  NECK AND BACK: Nontender.  MUSCULOSKELETAL: Full range of motion at all extremities.  NEUROLOGIC: Gait appears normal. Strength and reflexes appears symmetric and normal throughout. Cranial  nerves symmetric.  LUNGS: Clear without wheezes.  HEART: Regular rate and rhythm.  ABDOMEN: Soft, nontender, normal bowel sounds.  VITAL SIGNS: Temperature 97 degrees, pulse 101, respirations 20, blood pressure 119/74 but he does show signs of orthostasis. Blood sugars have all been running in the low to mid 100s.   LABORATORY RESULTS: Admission labs showed a drug screen positive for tricyclic antidepressants. TSH normal at 1.25. Alcohol undetected. Total protein elevated at 8.9, chloride low at 97. Sodium low at 133, glucose 139. CBC showed an elevated platelet count at 463. Urinalysis unremarkable.   ASSESSMENT: A 64 year old man who presents in a somewhat disorganized, emotionally labile, but primarily dysphoric, and depressed seeming state. His history strongly suggests bipolar disorder. He has recently been treated primarily with antidepressants, although it is not sure how compliant he  has been. Seems to probably be in a mixed manic depressed state with some psychotic features right now with acute dangerousness to himself. Needs hospitalization for treatment and stabilization psychiatrically and medically.   TREATMENT PLAN: Reviewed old chart, reviewed labs. Discussed the diagnosis and treatment plan with the patient. Overall, I think in terms of medicine, we should be trying to cut down on his antidepressants and start mood stabilizers, particularly one that may help with bipolar depression. I explained this to the patient and that I will be cutting down on his venlafaxine. I am also going to cut down on the nortriptyline. I would like to start him on lithium 600 mg a day at nighttime. I explained side effects and use of this. The patient will be engaged in groups and activities on the unit. Daily evaluation by the treatment team for improvement in symptoms. Try to get collateral history and work on discharge planning. Possibly get records from the [place].   DIAGNOSIS, PRINCIPLE AND PRIMARY:   AXIS I: Bipolar disorder type I, currently depressed.   SECONDARY DIAGNOSES: AXIS I: No further.   AXIS II: No diagnosis.   AXIS III: Diabetes, hypertension, ulcerative colitis.   AXIS IV: Moderate to severe from what sounds like having spent all of his money and having serious social difficulties.   AXIS V: Functioning at time of evaluation 30.    ____________________________ Gonzella Lex, MD jtc:cc D: 11/11/2012 21:21:28 ET T: 11/11/2012 23:21:04 ET JOB#: [id number]  cc: Gonzella Lex, MD, <Dictator> Gonzella Lex MD ELECTRONICALLY SIGNED 11/12/2012 23:08

## 2015-01-15 NOTE — Consult Note (Signed)
PATIENT NAME:  ELMO, RIO MR#:  [id number] DATE OF BIRTH:  05-18-1951  PSYCHIATRIC CONSULTATION  DATE OF CONSULTATION:  04/21/2013  CONSULTING PHYSICIAN: Maryan Puls, M.D.   CHIEF COMPLAINT: "I tried to abridge this.Marland KitchenMarland KitchenI came here for purely medical reasons and  I ended up here in psychiatry."   HISTORY OF PRESENT ILLNESS: Mr. Iyer is a 64 year old male who has a psychiatric diagnosis of bipolar disorder.   He also has many medical problems. He is followed at the [facility] by Dr. Dierdre Highman. He also gets his medications there.   He was admitted to the ED on 04/18/2013 at which time he was somewhat confused and making illogical statements. He reports that is because they were trying to move him to psychiatry and  he is not feeling psychiatrically unstable but wanted his medical problem addressed. He says to me "I come here for a medical problem and they put me back here. I have shoulder pain and arthritis. Emotions are not always a problem."    He denies any suicidal ideation, intent or plan, or any history of attempts.   He also denies homicidal ideation, intent or plan, or any attempts. He had had no history of such.   He says "this is not psychiatric; I just need treatment for my shoulder pains."   MENTAL STATUS EXAM: Mr. Osmun appears somewhat disheveled. He has very good eye contact and he is cooperative. He reports his sleep is good. He sleeps about 8 hours every night. His affect is a little blunted and he says that he is feeling a little down. His thought content is appropriate. His thought processes are linear, logical, and goal-oriented but a little slowed and deliberate, with some loosening and tangentiality. His memory appears to be intact. He denies any auditory or visual hallucinations. His psychomotor behavior is within normal limits. He is oriented x 4. His speech is appropriate. His insight is fair, and his judgment is fair-to-poor.   SOCIAL HISTORY: He reports that he lives  at the shelter now and is working with "a homeless veteran's program." He reports that he was living at his "36 year old father's." However, his brother and sister moved in and it got too crowded and it there was too much contention, so he moved out and moved to the shelter. He reports he feels very safe in his current living situation and that he can return there. 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   MEDICATIONS: Non psychiatric medicines.  1.  Terazosin 5 mg p.o. at bedtime.  2.  Lantus 70 units subcutaneously daily (at bedtime).  3.  Metformin 1000 mg p.o. b.i.d. with meals.  4.  Mesalamine 400 mg oral delayed-release capsule, 2 capsules 3 times a day.  5.  Tramadol 50 mg q.6 hours p.r.n., pain.  6.  Lisinopril p.o. daily for blood pressure.  7.  Cyclobenzaprine 10 mg p.o. daily at bedtime for pain.  8. Docusate sodium 100 mg b.i.d. for constipation.  9. Hydrocortisone 25 mg rectal suppository daily at bedtime.   PSYCHIATRIC MEDICINES: Mr. Hargadon came in on: Remeron 45 mg p.o. at bedtime, nortriptyline 25 mg at bedtime, and Dr. Guss Bunde started venlafaxine XR 75 mg daily on 04/19/2013.   ALLERGIES: NO KNOWN DRUG ALLERGIES.   PAST MEDICAL AND SURGICAL HISTORY:  Hyperlipidemia, colitis, spinal stenosis, degenerative disk disease, diabetes, CAD, obesity, GERD, hypertension, left hand surgery, left knee surgery, jaw surgery, left shoulder surgery, cardiac catheterization, carotid stent placement 2007, decompression L3-L4, TLIF  L4-L5.   DIAGNOSIS, PRINCIPAL AND PRIMARY: Bipolar disorder, not otherwise specified.   AXIS III: Hyperlipidemia, colitis, spinal stenosis, degenerative disk disease, diabetes, CAD, obesity, GERD, hypertension, left hand surgery, left knee surgery, jaw surgery, left shoulder surgery, cardiac catheterization, carotid stent placement 2007, decompression L3-L4, TLIF L4-L5.   PLAN: Mr. Josepha PiggHatley was placed under IVC in the ED, I will terminate that. He will be  discharged back to his placement with followup arranged.     ____________________________ Izola PriceFrances C. Jaclynn MajorGreason, MD fcg:dm D: 04/21/2013 12:37:45 ET T: 04/21/2013 13:13:59 ET JOB#: [id number]  cc: Izola PriceFrances C. Jaclynn MajorGreason, MD, <Dictator> Maryan PulsFRANCES C Tynisha Ogan MD ELECTRONICALLY SIGNED 04/21/2013 13:51

## 2015-01-15 NOTE — Consult Note (Signed)
PATIENT NAME:  Jeffery Beck, Koji E MR#:  [id number] DATE OF BIRTH:  09/22/1951  DATE OF CONSULTATION:  11/17/2012  CONSULTING PHYSICIAN:  Scot Junobert T. Brealyn Baril, MD  HISTORY OF PRESENT ILLNESS:  The patient is a 64 year old white male who was admitted to the psychiatry because of deteriorating mental status. The patient has a history of ulcerative colitis. He has [facility]. His last flare was 2 to 3 years ago at the [facility]. He it seems to be having a flare now at this time with rectal bleeding x 4 and a small amount of blood on the paper and in the water. And I was asked to see him in consultation for flare of ulcerative colitis.   The patient is not sure if he has left-sided colitis, proctitis or pancolitis. He thinks it is left-sided because it is "a mild case of colitis." His last flare  was 2 to 3 years ago and he had a colon exam at the [facility]. He had been having bright red blood per rectum and on the paper and in the water recently.   The patient normally takes Asacol 800 mg 3 times a day. Sometimes he takes 1 in the morning and 2 in the evening and somebody is able to take it 3 times a day. He had been on prednisone in the past and it does get some difficulty sleeping.   OTHER MEDICAL HISTORY:  The patient has been evaluated in the ER and appeared to be in a psychotic state with mania. He was admitted to the hospital and is getting treatment for this with changes in medications.   HABITS: Has not smoked or drank for 20 years.   SOCIAL HISTORY: Married twice. He is getting disability. He is not able to work; however, in the past he worked as a Chartered loss adjusterschoolteacher for years.   REVIEW OF SYSTEMS: No chest pains. No shortness of breath. No abdominal pain.   MEDICATIONS ON ADMISSION:  Included Celebrex 100 mg a day, Klonopin 0.5 mg 3 times a day, Flexeril 10 mg at night, hydrochlorothiazide 25 mg a day, Lantus insulin 70 units at night, lisinopril 20 mg a day, Delzicol 800 milligrams every  evening, which Asacol, metformin 1000 mg twice a day, Remeron 40 mg a 45 mg at night, Nortriptyline 150 mg at night, Hytrin 5 mg at night, Ultram 50 mg up to 4 times a day, venlafaxine 150 mg per day.   ALLERGIES: No known drug allergies.   PHYSICAL EXAMINATION: Exam will be postponed until the time of this procedure.   ASSESSMENT: Flare of ulcerative colitis, minor flare.   PLAN: Plan is to do a flexible sigmoidoscopy on Tuesday morning. We will have him come to endocrinology, put an IV in him, have him do Fleet enema and do the procedure with some moderate sedation. In the meantime, we will change his Asacol  to 800 mg 3 times a day and hold the prednisone for now.      ____________________________ Scot Junobert T. Ziere Docken, MD rte:cc D: 11/17/2012 15:39:59 ET T: 11/17/2012 16:00:53 ET JOB#: [id number]  cc: Scot Junobert T. Bardia Wangerin, MD, <Dictator> Audery AmelJohn T. Clapacs, MD Scot JunOBERT T Usiel Astarita MD ELECTRONICALLY SIGNED 12/18/2012 15:03

## 2015-01-15 NOTE — Discharge Summary (Signed)
PATIENT NAME:  Jeffery Beck, Jeffery E MR#:  [id number] DATE OF BIRTH:  1951-01-21  DATE OF ADMISSION:  11/10/2012 DATE OF DISCHARGE:  11/21/2012  HOSPITAL COURSE: See dictated history and physical for details of admission. This 64 year old man with a history of bipolar disorder came into the hospital with what appeared to be a mixed state with evidence of depressed mood and suicidality as well as racing thoughts, some grandiosity, agitated behavior. The patient had been treated with multiple antidepressants recently. Medically, the treatment was to start him on lithium, which he has tolerated well, and to gradually decrease his antidepressant medicine. He has tolerated all of this fine. I have been able to get him off of the Effexor and decrease the dose of the nortriptyline down to a dose more appropriate to chronic pain use. The Remeron has stayed intact. He has tolerated lithium and has a good blood level with no sign of any complications or side effects. The patient was initially withdrawn and depressed but has become much more outgoing. He is appropriately interactive with peers and in groups. His thinking is much more clear and lucid. He is totally denying any suicidal ideation. He expresses optimism and hopefulness for his situation. The patient also suffered a worsening of his ulcerative colitis in the hospital and was seen by GI. A flex-sig was done and just showed a flare-up of his ulcerative colitis. He has been treated with some changes in medicine and will follow up through the [facility]. At the time of discharge, the patient is upbeat but not manic. Thinking is lucid. He denies suicidal ideation. He agrees to outpatient treatment. He will be referred to [facility] for follow-up mental health care.   LABORATORY RESULTS: Multiple labs were done during the hospital stay. His admission labs showed positive tricyclic antidepressants. TSH 1.2, which is normal. Alcohol not detected. Chemistry showed a low chloride 97,  sodium 133, glucose 139.  His admission blood count showed an elevated platelet count at 463, otherwise unremarkable. Urinalysis was unremarkable. Salicylates and acetaminophen nontoxic. He has had rechecks of chemistries which have tended to be normal. Hemoglobin A1c was mildly elevated at 8.5. Lithium level was 1.2 but with no sign of any toxicity. He has had multiple hemoglobin draws because of some blood in his stool but has stabilized at a hemoglobin in the mid 12s. C-reactive protein was done on the 23rd and was high at 7.2. Blood sugars have tended to stay in the mid 100s.   DISCHARGE MEDICATIONS: Keflex 500 mg q. 8 hours for 7 more days, hydrocortisone rectal suppositories 25 mg at bedtime, mesalamine 800 mg 3 times a day, ferrous gluconate 240 mg once a day, Colace 100 mg twice a day, nortriptyline 25 mg at bedtime, lithium 900 mg at night, tramadol 50 mg every 6 hours p.r.n. for pain, Hytrin 5 mg p.o. at bedtime, Remeron 45 mg at bedtime, metformin 1000 mg twice a day, Zestril 20 mg per day, Lantus insulin 70 units subcutaneous at bedtime, hydroxyzine 50 mg q. 4 to 6 hours p.r.n. for anxiety, Flexeril 10 mg at night, Klonopin 0.5 mg q. 8 hours, Celebrex 100 mg per day.   MENTAL STATUS EXAM AT DISCHARGE: A still somewhat disheveled man who looks his stated age, cooperative and pleasant in the interview. Good eye contact, normal psychomotor activity. Speech is normal in rate, tone and volume with no sign of pressure. Affect is euthymic, reactive and appropriate. Mood is stated as being good. Thoughts are lucid without any sign  of loosening of associations or bizarre thinking. No grandiosity expressed. He denies suicidal or homicidal ideation, denies hallucinations. Judgment and insight are improved. Short and long-term memory are intact. Intelligence normal.   DISPOSITION: He is discharged to his own home and will follow up medically at the [place] and psychiatrically with [facility].   DIAGNOSIS, PRINCIPAL  AND PRIMARY:  AXIS I: Bipolar disorder, type I, mixed state.   SECONDARY DIAGNOSES: AXIS I: No further diagnosis.   AXIS II: Deferred.   AXIS III:  1. Ulcerative colitis.  2. Diabetes.  3. Hypertension.  4. Anemia, probably secondary to the ulcerative colitis.  5. Chronic pain.   AXIS IV: Severe from financial stresses and lack of support from his family.   AXIS V: Functioning at time of discharge: 55.  ____________________________ Audery Amel, MD jtc:cb D: 11/21/2012 15:28:51 ET T: 11/21/2012 18:12:01 ET JOB#: [id number] cc: Audery Amel, MD, <Dictator> Audery Amel MD ELECTRONICALLY SIGNED 11/21/2012 23:33

## 2015-01-15 NOTE — Consult Note (Signed)
Pt with distal proctitis, he has mesalamine supp at home and oral meds.  I advised him to use the pills one 3 times a day with food and the suppository every nite until bleeding stops then decrease to every other nite and follow up with the [facility].   He has unusual irritation near iv site, placed on antibiotic from hospitalist.  Electronic Signatures: Scot JunElliott, Alexandr Oehler T (MD)  (Signed on 26-Feb-14 19:36)  Authored  Last Updated: 26-Feb-14 19:36 by Scot JunElliott, Madinah Quarry T (MD)

## 2015-01-15 NOTE — Consult Note (Signed)
PATIENT NAME:  Jeffery Beck, Jeffery Beck MR#:  [id number] DATE OF BIRTH:  1950/10/04  DATE OF CONSULTATION:  11/16/2012  REFERRING PHYSICIAN:  Dr. Toni Amend. CONSULTING PHYSICIAN:  Sani Loiseau H. Allena Katz, MD  REASON FOR CONSULTATION:  Bright red blood per rectum in a patient with ulcerative colitis as well as diabetes management.   HISTORY OF PRESENT ILLNESS:  The patient is a 64 year old white male with history of coronary artery disease, diabetes, hypertension, ulcerative colitis, hyperlipidemia, depression who is currently admitted for depression who reports that prior to being admitted he was not eating for at least 4 to 5 days.  And then he started eating some.  Since yesterday he noticed he has had bright red blood per rectum.  He has not had any diarrhea.  He has had strain to have a bowel movement.  The patient reports that normally with his ulcerative colitis flare he normally has diarrhea and bright red blood when he has his ulcerative colitis flare.  The patient has not had any fever or abdominal pain.  He otherwise has been very depressed and reports that he has had a hard time sleeping, but he denies any chest pains, palpitations.  No nausea, vomiting or diarrhea.  No hematemesis.  Denies any urinary symptoms.   PAST MEDICAL HISTORY: Significant for:  1.  Coronary artery disease with a history of stent placement to the proximal LAD and mid RCA.  2.  Diabetes.  3.  Hypertension.  4.  Hyperlipidemia.  5.  Depression.  6.  Ulcerative colitis.   PAST SURGICAL HISTORY: 1.  Status post left shoulder surgery in the past.  2.  Status post left knee surgery.  3.  Status post jaw surgery.   CURRENT MEDICATIONS:  Celebrex 100 daily, Flexeril 10 mg at bedtime, hydrochlorothiazide 25 by mouth daily, Lantus 70 units subQ at bedtime, lisinopril 20 mg daily, mesalamine 400 mg 2 tabs at bedtime, metformin 1000 1 tab by mouth twice daily, mirtazapine 15 mg at bedtime, nortriptyline 150 at bedtime, terazosin 5 mg at  bedtime, tramadol 50 1 tab by mouth 2 to 4 times a day, Effexor 150 mg extended release daily.   SOCIAL HISTORY:  The patient used to smoke, but quit.  Does not drink.  No alcohol or drug use.   FAMILY HISTORY:  Father had coronary artery disease with quadruple bypass.   REVIEW OF SYSTEMS:  CONSTITUTIONAL:  Denies any fevers.  No chills.  Denies any weight gain or weight loss.  He does complain of feeling fatigued and depressed.  HEENT:  Denies any visual difficulties.  No blurred vision.  No glaucoma.  No cataracts.  EARS, NOSE, THROAT:  Denies any tinnitus.  No hearing difficulties.  Denies any seasonal allergies.  No difficulty swallowing.  CARDIOVASCULAR:  Denies any chest pain, palpitations.  No syncope.  Has a history of coronary artery disease.  PULMONARY:  Denies any cough, wheezing or hemoptysis.  No COPD, no asthma.  GASTROINTESTINAL:  Denies any nausea, vomiting or diarrhea.  Complains of bright red blood per rectum and constipation.  ENDOCRINE:  Denies any polydipsia, polyuria.  Has diabetes.  MUSCULOSKELETAL:  Has chronic back pain.   NEUROLOGIC:  Denies any CVA, TIA or seizures.  PSYCHIATRIC:  The patient is very depressed and anxious.   PHYSICAL EXAMINATION: VITAL SIGNS:  Temperature 98.1, pulse was 93, respirations 20, blood pressure 119/75.  GENERAL:  The patient is an obese white male currently not in any acute distress.  HEENT:  Pupils equal,  round, reactive to light and accommodation.  There is no conjunctival pallor.  No scleral icterus.  Nasal exam showed no drainage or ulceration.  Oropharynx is clear without any exudate.  NECK:  No thyromegaly.  No carotid bruits.  CARDIOVASCULAR:  Regular rate and rhythm.  No murmurs, rubs, clicks or gallops.  PMI is not displaced.  LUNGS:  Clear to auscultation bilaterally without any rales, rhonchi or wheezing.  ABDOMEN:  Soft, nontender, nondistended.  Positive bowel sounds x 4.  EXTREMITIES:  No clubbing, cyanosis, or edema.   SKIN:  No rash.  LYMPHATICS:  No lymph nodes palpable.  VASCULAR:  Good DP, PT pulses.  PSYCHIATRIC:  Is a little depressed, currently not anxious.  Awake, alert, oriented x 3.  No focal deficits.   ASSESSMENT AND PLAN:  The patient is a 64 year old white male with history of coronary artery disease, diabetes, hypertension, ulcerative colitis, was admitted for depression, now complains of bright red blood per rectum.  1.  Bright red blood per rectum, possibly due to ulcerative colitis flare, but no abdominal pain, has no diarrhea.  At this time we will go ahead and start him on prednisone at 40 mg for a possible flare.  He reports that he wants to start this in a.m.  He is worried about being anxious from it and not sleeping.  Also has external hemorrhoids on examination, but not inflamed.  That could also cause the bleeding.  I will ask GI to see regarding possible ulcerative colitis flare.  We will also continue mesalamine as taking at home.  2.  Diabetes type 2.  We will continue Lantus and metformin.  His blood glucose have been labile, some low in the morning due to unpredictable by mouth intake, may need to decrease Lantus dose.  We will follow blood sugar tonight and adjust.  3.  Hypertension.  We will continue lisinopril.  Earlier this morning he did have some orthostatic changes.  So if his blood pressure starts decreasing we will stop the lisinopril.  4.  Coronary artery disease.  Recommend aspirin once his bleeding is resolved.    TIME SPENT:  45 minutes spent.       ____________________________ Lacie ScottsShreyang H. Allena KatzPatel, MD shp:ea D: 11/16/2012 19:33:53 ET T: 11/17/2012 00:00:42 ET JOB#: [id number]  cc: Fahmida Jurich H. Allena KatzPatel, MD, <Dictator> Charise CarwinSHREYANG H Keanen Dohse MD ELECTRONICALLY SIGNED 11/18/2012 12:30

## 2016-07-10 ENCOUNTER — Other Ambulatory Visit: Payer: Self-pay | Admitting: Physical Medicine and Rehabilitation

## 2016-07-10 DIAGNOSIS — M545 Low back pain, unspecified: Secondary | ICD-10-CM

## 2016-07-10 DIAGNOSIS — T1590XA Foreign body on external eye, part unspecified, unspecified eye, initial encounter: Secondary | ICD-10-CM

## 2016-07-24 ENCOUNTER — Inpatient Hospital Stay: Admission: RE | Admit: 2016-07-24 | Payer: Self-pay | Source: Ambulatory Visit

## 2016-07-24 ENCOUNTER — Other Ambulatory Visit: Payer: Self-pay

## 2016-08-07 ENCOUNTER — Ambulatory Visit
Admission: RE | Admit: 2016-08-07 | Discharge: 2016-08-07 | Disposition: A | Discharge status: Home / Self Care | Payer: No Typology Code available for payment source | Source: Ambulatory Visit | Attending: Physical Medicine and Rehabilitation | Admitting: Physical Medicine and Rehabilitation

## 2016-08-07 DIAGNOSIS — M545 Low back pain, unspecified: Secondary | ICD-10-CM

## 2016-08-07 DIAGNOSIS — T1590XA Foreign body on external eye, part unspecified, unspecified eye, initial encounter: Secondary | ICD-10-CM

## 2019-06-22 ENCOUNTER — Encounter: Payer: Self-pay | Admitting: Emergency Medicine

## 2019-06-22 ENCOUNTER — Emergency Department
Admission: EM | Admit: 2019-06-22 | Discharge: 2019-06-22 | Disposition: A | Discharge status: Home / Self Care | Payer: No Typology Code available for payment source | Attending: Emergency Medicine | Admitting: Emergency Medicine

## 2019-06-22 ENCOUNTER — Other Ambulatory Visit: Payer: Self-pay

## 2019-06-22 ENCOUNTER — Emergency Department: Payer: No Typology Code available for payment source

## 2019-06-22 DIAGNOSIS — S199XXA Unspecified injury of neck, initial encounter: Secondary | ICD-10-CM | POA: Diagnosis present

## 2019-06-22 DIAGNOSIS — R42 Dizziness and giddiness: Secondary | ICD-10-CM | POA: Insufficient documentation

## 2019-06-22 DIAGNOSIS — Y929 Unspecified place or not applicable: Secondary | ICD-10-CM | POA: Insufficient documentation

## 2019-06-22 DIAGNOSIS — M7918 Myalgia, other site: Secondary | ICD-10-CM

## 2019-06-22 DIAGNOSIS — Y9389 Activity, other specified: Secondary | ICD-10-CM | POA: Insufficient documentation

## 2019-06-22 DIAGNOSIS — I10 Essential (primary) hypertension: Secondary | ICD-10-CM | POA: Insufficient documentation

## 2019-06-22 DIAGNOSIS — Y998 Other external cause status: Secondary | ICD-10-CM | POA: Diagnosis not present

## 2019-06-22 DIAGNOSIS — S161XXA Strain of muscle, fascia and tendon at neck level, initial encounter: Secondary | ICD-10-CM | POA: Diagnosis not present

## 2019-06-22 HISTORY — DX: Other chronic pain: G89.29

## 2019-06-22 HISTORY — DX: Essential (primary) hypertension: I10
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
## 2019-06-22 NOTE — ED Notes (Signed)
Pt returned from CT °

## 2019-06-22 NOTE — Discharge Instructions (Signed)
Please continue your daily medications. Follow-up with your primary care provider if you do not feel you are improving over the week. Return to the emergency department for symptoms of change or worsen if unable to schedule an appointment.
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
## 2019-12-24 DIAGNOSIS — F0781 Postconcussional syndrome: Secondary | ICD-10-CM | POA: Insufficient documentation

## 2019-12-24 DIAGNOSIS — R519 Headache, unspecified: Secondary | ICD-10-CM | POA: Insufficient documentation

## 2019-12-24 DIAGNOSIS — G44329 Chronic post-traumatic headache, not intractable: Secondary | ICD-10-CM | POA: Insufficient documentation

## 2019-12-29 ENCOUNTER — Other Ambulatory Visit: Payer: Self-pay | Admitting: Acute Care

## 2019-12-29 DIAGNOSIS — G44329 Chronic post-traumatic headache, not intractable: Secondary | ICD-10-CM

## 2020-01-07 ENCOUNTER — Other Ambulatory Visit (HOSPITAL_COMMUNITY): Payer: Self-pay | Admitting: Acute Care

## 2020-01-07 ENCOUNTER — Other Ambulatory Visit: Payer: Self-pay | Admitting: Acute Care

## 2020-01-07 DIAGNOSIS — G44329 Chronic post-traumatic headache, not intractable: Secondary | ICD-10-CM

## 2020-01-07 DIAGNOSIS — R519 Headache, unspecified: Secondary | ICD-10-CM

## 2020-01-22 ENCOUNTER — Other Ambulatory Visit: Payer: Self-pay

## 2020-01-22 ENCOUNTER — Ambulatory Visit
Admission: RE | Admit: 2020-01-22 | Discharge: 2020-01-22 | Disposition: A | Discharge status: Home / Self Care | Payer: No Typology Code available for payment source | Source: Ambulatory Visit | Attending: Acute Care | Admitting: Acute Care

## 2020-01-22 DIAGNOSIS — G44329 Chronic post-traumatic headache, not intractable: Secondary | ICD-10-CM

## 2020-01-22 DIAGNOSIS — R519 Headache, unspecified: Secondary | ICD-10-CM | POA: Diagnosis present

## 2020-03-10 ENCOUNTER — Ambulatory Visit: Payer: No Typology Code available for payment source

## 2020-03-26 ENCOUNTER — Ambulatory Visit: Payer: No Typology Code available for payment source

## 2021-11-15 DIAGNOSIS — Z79899 Other long term (current) drug therapy: Secondary | ICD-10-CM | POA: Insufficient documentation

## 2021-11-15 DIAGNOSIS — M899 Disorder of bone, unspecified: Secondary | ICD-10-CM | POA: Insufficient documentation

## 2021-11-15 DIAGNOSIS — G894 Chronic pain syndrome: Secondary | ICD-10-CM | POA: Insufficient documentation

## 2021-11-15 DIAGNOSIS — Z789 Other specified health status: Secondary | ICD-10-CM | POA: Insufficient documentation

## 2021-11-15 NOTE — Progress Notes (Signed)
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 of chronic pain  <18 kg/m2 Underweight   18.5-24.9 kg/m2 Ideal body weight   25-29.9 kg/m2 Overweight Increased incidence by 20%  30-34.9 kg/m2 Obese (Class I) Increased incidence by 68%  35-39.9 kg/m2 Severe obesity (Class II) Increased incidence by 136%  >40 kg/m2 Extreme obesity (Class III) Increased incidence by 254%   Patient's current BMI Ideal Body weight  Body mass index is 26.88 kg/m. Ideal body weight: 74.1 kg (163 lb 7.5 oz) Adjusted ideal body weight: 79 kg (174 lb 1.3 oz)   BMI Readings from Last 4 Encounters:  11/16/21 26.88 kg/m  06/22/19 31.57 kg/m   Wt Readings from Last 4 Encounters:  11/16/21 190 lb (86.2 kg)  06/22/19 220 lb (99.8 kg)    Psych/Mental status: Alert, oriented x 3 (person, place, & time)       Eyes: PERLA Respiratory: No evidence of acute respiratory distress  Assessment  Primary  Diagnosis & Pertinent Problem List: The primary encounter diagnosis was Chronic pain syndrome. Diagnoses of Chronic neck pain (1ry area of Pain) (Bilateral) (L>R), Cervical facet syndrome, Cervicalgia, Impaired range of motion of cervical spine, Weakness of upper extremity (Left), DDD (degenerative disc disease), cervical, Abnormal CT scan, cervical spine (06/22/2019), Chronic upper extremity pain (2ry area of Pain) (Left), Pain and numbness of upper extremity (Left), Cervical radiculopathy at C8 (Left), Chronic shoulder pain (3ry area of Pain) (Bilateral), Chronic low back pain (4th area of Pain) (Bilateral) w/o sciatica, Failed back surgical syndrome, Abnormal MRI, lumbar spine (08/07/2016), Pharmacologic therapy, Disorder of skeletal system, Problems influencing health status, Lumbosacral radiculopathy at S1 (Right), Weakness of lower extremity (Right), Neurostimulator device in situ ([facility] SCS), DDD (degenerative disc disease), lumbosacral, Lumbar facet joint pain (Bilateral), Lumbar facet joint syndrome, and Chronic lower extremity pain (5th area of Pain) (Bilateral) (R>L) were also pertinent to this visit.  Visit Diagnosis (New problems to examiner): 1. Chronic pain syndrome   2. Chronic neck pain (1ry area of Pain) (Bilateral) (L>R)   3. Cervical facet syndrome   4. Cervicalgia   5. Impaired range of motion of cervical spine   6. Weakness of upper extremity (Left)   7. DDD (degenerative disc disease), cervical   8. Abnormal CT scan, cervical spine (06/22/2019)   9. Chronic upper extremity pain (2ry area of Pain) (Left)   10. Pain and numbness of upper extremity (Left)   11. Cervical radiculopathy at C8 (Left)   12. Chronic shoulder pain (3ry area of Pain) (Bilateral)   13. Chronic low back pain (4th area of Pain) (Bilateral) w/o sciatica   14. Failed back surgical syndrome   15. Abnormal MRI, lumbar spine (08/07/2016)   16. Pharmacologic therapy   17. Disorder of skeletal system   18.  Problems influencing health status   19. Lumbosacral radiculopathy at S1 (Right)   20. Weakness of lower extremity (Right)   21. Neurostimulator device in situ ([facility] SCS)   22. DDD (degenerative disc disease), lumbosacral   23. Lumbar facet joint pain (Bilateral)   24. Lumbar facet joint syndrome   25. Chronic lower extremity pain (5th area of Pain) (Bilateral) (R>L)    Plan of Care (Initial workup plan)  Note: Mr. Wender was reminded that as per protocol, today's visit has been an evaluation only. We have not taken over the patient's controlled substance management.  Problem-specific plan: No problem-specific Assessment & Plan notes found for this encounter.  Lab Orders         Compliance Drug Analysis, Ur         Comp. Metabolic Panel (12)         Magnesium         Vitamin B12         Sedimentation rate         25-Hydroxy vitamin D Lcms D2+D3         C-reactive protein     Imaging Orders         DG Cervical Spine With Flex & Extend         DG Lumbar Spine Complete W/Bend         DG Shoulder Right         DG Shoulder Left         MR CERVICAL SPINE WO CONTRAST     Referral Orders  No referral(s) requested today   Procedure Orders    No procedure(s) ordered today   Pharmacotherapy (  current): Medications ordered:  No orders of the defined types were placed in this encounter.  Medications administered during this visit: [employer]. Bressi had no medications administered during this visit.   Pharmacological management options:  Opioid Analgesics: The patient was informed that there is no guarantee that he would be a candidate for opioid analgesics. The decision will be made following CDC guidelines. This decision will be based on the results of diagnostic studies, as well as Mr. Delaguila's risk profile.   Membrane stabilizer: To be determined at a later time  Muscle relaxant: To be determined at a later time  NSAID: To be determined at a later time  Other analgesic(s): To be  determined at a later time   Interventional management options: Mr. Patlan was informed that there is no guarantee that he would be a candidate for interventional therapies. The decision will be based on the results of diagnostic studies, as well as Mr. Keefe's risk profile.  Procedure(s) under consideration:  Pending results of ordered studies      Interventional Therapies  Risk   Complexity Considerations:   Estimated body mass index is 26.88 kg/m as calculated from the following:   Height as of this encounter: 5' 10.5" (1.791 m).   Weight as of this encounter: 190 lb (86.2 kg). WNL   Planned   Pending:   Diagnostic x-rays of both shoulders, the lumbar spine, cervical spine, as well as an MRI of the cervical spine.  Later on we may also order x-rays of his right hip joint.  Today we have also ordered a nerve conduction test of the lower extremities.   Under consideration:   Diagnostic left cervical epidural steroid injection #1  Diagnostic bilateral cervical facet medial branch block #1    Completed:   None at this time   Therapeutic   Palliative (PRN) options:   None established    Provider-requested follow-up: Return for (70mn), Eval-day (M,W), (F2F), 2nd Visit, for review of ordered tests.  No future appointments.   Note by: FGaspar Cola MD Date: 11/16/2021; Time: 6:02 AM

## 2021-11-16 ENCOUNTER — Ambulatory Visit
Admission: RE | Admit: 2021-11-16 | Discharge: 2021-11-16 | Disposition: A | Discharge status: Home / Self Care | Payer: No Typology Code available for payment source | Source: Ambulatory Visit | Attending: Pain Medicine | Admitting: Pain Medicine

## 2021-11-16 ENCOUNTER — Other Ambulatory Visit: Payer: Self-pay

## 2021-11-16 ENCOUNTER — Other Ambulatory Visit
Admission: RE | Admit: 2021-11-16 | Discharge: 2021-11-16 | Disposition: A | Discharge status: Home / Self Care | Payer: No Typology Code available for payment source | Source: Home / Self Care | Attending: Pain Medicine | Admitting: Pain Medicine

## 2021-11-16 ENCOUNTER — Ambulatory Visit
Admission: RE | Admit: 2021-11-16 | Discharge: 2021-11-16 | Disposition: A | Discharge status: Home / Self Care | Payer: No Typology Code available for payment source | Attending: Pain Medicine | Admitting: Pain Medicine

## 2021-11-16 ENCOUNTER — Ambulatory Visit (HOSPITAL_BASED_OUTPATIENT_CLINIC_OR_DEPARTMENT_OTHER): Payer: No Typology Code available for payment source | Admitting: Pain Medicine

## 2021-11-16 VITALS — BP 147/72 | HR 74 | Temp 97.3°F | Resp 16 | Ht 70.5 in | Wt 190.0 lb

## 2021-11-16 DIAGNOSIS — M79602 Pain in left arm: Secondary | ICD-10-CM | POA: Diagnosis present

## 2021-11-16 DIAGNOSIS — M542 Cervicalgia: Secondary | ICD-10-CM

## 2021-11-16 DIAGNOSIS — M545 Low back pain, unspecified: Secondary | ICD-10-CM

## 2021-11-16 DIAGNOSIS — M47816 Spondylosis without myelopathy or radiculopathy, lumbar region: Secondary | ICD-10-CM | POA: Insufficient documentation

## 2021-11-16 DIAGNOSIS — M25511 Pain in right shoulder: Secondary | ICD-10-CM | POA: Insufficient documentation

## 2021-11-16 DIAGNOSIS — R937 Abnormal findings on diagnostic imaging of other parts of musculoskeletal system: Secondary | ICD-10-CM | POA: Insufficient documentation

## 2021-11-16 DIAGNOSIS — G894 Chronic pain syndrome: Secondary | ICD-10-CM | POA: Diagnosis not present

## 2021-11-16 DIAGNOSIS — R29898 Other symptoms and signs involving the musculoskeletal system: Secondary | ICD-10-CM

## 2021-11-16 DIAGNOSIS — Z79899 Other long term (current) drug therapy: Secondary | ICD-10-CM | POA: Insufficient documentation

## 2021-11-16 DIAGNOSIS — M5030 Other cervical disc degeneration, unspecified cervical region: Secondary | ICD-10-CM | POA: Insufficient documentation

## 2021-11-16 DIAGNOSIS — M899 Disorder of bone, unspecified: Secondary | ICD-10-CM

## 2021-11-16 DIAGNOSIS — M25512 Pain in left shoulder: Secondary | ICD-10-CM | POA: Diagnosis present

## 2021-11-16 DIAGNOSIS — G8929 Other chronic pain: Secondary | ICD-10-CM

## 2021-11-16 DIAGNOSIS — M961 Postlaminectomy syndrome, not elsewhere classified: Secondary | ICD-10-CM

## 2021-11-16 DIAGNOSIS — M5137 Other intervertebral disc degeneration, lumbosacral region: Secondary | ICD-10-CM | POA: Insufficient documentation

## 2021-11-16 DIAGNOSIS — Z789 Other specified health status: Secondary | ICD-10-CM

## 2021-11-16 DIAGNOSIS — M47812 Spondylosis without myelopathy or radiculopathy, cervical region: Secondary | ICD-10-CM | POA: Insufficient documentation

## 2021-11-16 DIAGNOSIS — M5382 Other specified dorsopathies, cervical region: Secondary | ICD-10-CM | POA: Insufficient documentation

## 2021-11-16 DIAGNOSIS — M79604 Pain in right leg: Secondary | ICD-10-CM

## 2021-11-16 DIAGNOSIS — M5459 Other low back pain: Secondary | ICD-10-CM

## 2021-11-16 DIAGNOSIS — R200 Anesthesia of skin: Secondary | ICD-10-CM | POA: Insufficient documentation

## 2021-11-16 DIAGNOSIS — M79605 Pain in left leg: Secondary | ICD-10-CM | POA: Insufficient documentation

## 2021-11-16 DIAGNOSIS — Z9682 Presence of neurostimulator: Secondary | ICD-10-CM | POA: Insufficient documentation

## 2021-11-16 DIAGNOSIS — M5412 Radiculopathy, cervical region: Secondary | ICD-10-CM | POA: Insufficient documentation

## 2021-11-16 DIAGNOSIS — M5417 Radiculopathy, lumbosacral region: Secondary | ICD-10-CM | POA: Insufficient documentation

## 2021-11-16 DIAGNOSIS — M51379 Other intervertebral disc degeneration, lumbosacral region without mention of lumbar back pain or lower extremity pain: Secondary | ICD-10-CM

## 2021-11-16 LAB — COMPREHENSIVE METABOLIC PANEL
ALT: 11 U/L (ref 0–44)
AST: 18 U/L (ref 15–41)
Albumin: 3.9 g/dL (ref 3.5–5.0)
Alkaline Phosphatase: 135 U/L — ABNORMAL HIGH (ref 38–126)
Anion gap: 9 (ref 5–15)
BUN: 25 mg/dL — ABNORMAL HIGH (ref 8–23)
CO2: 26 mmol/L (ref 22–32)
Calcium: 9.1 mg/dL (ref 8.9–10.3)
Chloride: 102 mmol/L (ref 98–111)
Creatinine, Ser: 1.54 mg/dL — ABNORMAL HIGH (ref 0.61–1.24)
GFR, Estimated: 48 mL/min — ABNORMAL LOW (ref >60)
Glucose, Bld: 132 mg/dL — ABNORMAL HIGH (ref 70–99)
Potassium: 4.6 mmol/L (ref 3.5–5.1)
Sodium: 137 mmol/L (ref 135–145)
Total Bilirubin: 0.8 mg/dL (ref 0.3–1.2)
Total Protein: 7.5 g/dL (ref 6.5–8.1)

## 2021-11-16 LAB — MAGNESIUM: Magnesium: 2 mg/dL (ref 1.7–2.4)

## 2021-11-16 LAB — SEDIMENTATION RATE: Sed Rate: 18 mm/hr (ref 0–20)

## 2021-11-16 LAB — C-REACTIVE PROTEIN: CRP: 0.7 mg/dL (ref <1.0)

## 2021-11-16 LAB — VITAMIN B12: Vitamin B-12: 268 pg/mL (ref 180–914)

## 2021-11-16 NOTE — Progress Notes (Signed)
Safety precautions to be maintained throughout the outpatient stay will include: orient to surroundings, keep bed in low position, maintain call bell within reach at all times, provide assistance with transfer out of bed and ambulation.  

## 2021-11-21 LAB — COMPLIANCE DRUG ANALYSIS, UR

## 2021-11-24 LAB — 25-HYDROXY VITAMIN D LCMS D2+D3
25-Hydroxy, Vitamin D-2: <1 ng/mL
25-Hydroxy, Vitamin D-3: 20 ng/mL
25-Hydroxy, Vitamin D: 20 ng/mL — ABNORMAL LOW

## 2022-04-10 NOTE — Progress Notes (Unsigned)
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 pack on the right, lead tips at the level of T8. There is straightening of normal lordosis. Dextroscoliotic curvature centered at L3-L4. Limited range of motion on flexion and extension but no evidence of instability. Prominent L2-L3 disc space loss and spurring. Moderate spurring at L3-L4 with slight disc space narrowing. Moderate facet hypertrophy at L3-L4, mild facet hypertrophy at L2-L3. no vertebral body compression fracture, evidence of focal bone lesion or bone destruction.  IMPRESSION: 1. Posterior lumbar fusion at L4-L5. No evidence of hardware failure. 2. Dextroscoliotic curvature centered at L3-L4 and straightening of normal lordosis. Limited range of motion on flexion and extension but no  evidence of instability. 3.  L2-L3 and L3-L4 degenerative disc disease and facet hypertrophy.   Electronically Signed By: Keith Rake M.D. On: 11/18/2021 12:23  Complexity Note: Imaging results reviewed. Results shared with Jeffery Beck, using Layman's terms.                        Meds   Current Outpatient Medications:    atorvastatin (LIPITOR) 10 MG tablet, Take by mouth., Disp: , Rfl:    dapagliflozin propanediol (FARXIGA) 5 MG TABS tablet, Take by mouth., Disp: , Rfl:    Insulin Glargine (LANTUS [place]), Inject 22 Units into the skin at bedtime., Disp: , Rfl:    LISINOPRIL PO, Take by mouth., Disp: , Rfl:    MIRTAZAPINE PO, Take 1 tablet by mouth at bedtime., Disp: , Rfl:    nortriptyline (PAMELOR) 10 MG capsule, Take 10 mg by mouth at bedtime., Disp: , Rfl:    pregabalin (LYRICA) 75 MG capsule, Take 75 mg by mouth at bedtime., Disp: , Rfl:    Semaglutide (OZEMPIC, 1 MG/DOSE, [employer]), Inject 1 mg into the skin once a week., Disp: , Rfl:    traMADol (ULTRAM) 50 MG tablet, Take 50 mg by mouth as needed., Disp: , Rfl:    traZODone (DESYREL) 100 MG tablet, Take 100 mg by mouth at bedtime., Disp: , Rfl:    VENLAFAXINE HCL PO, Take by mouth., Disp: , Rfl:   ROS  Constitutional: Denies any fever or chills Gastrointestinal: No reported hemesis, hematochezia, vomiting, or acute GI distress Musculoskeletal: Denies any acute onset joint swelling, redness, loss of ROM, or weakness Neurological: No reported episodes of acute onset apraxia, aphasia, dysarthria, agnosia, amnesia, paralysis, loss of coordination, or loss of consciousness  Allergies  Jeffery Beck has No Known Allergies.  PFSH  Drug: Jeffery Beck  reports no history of drug use. Alcohol:  reports that he does not currently use alcohol. Tobacco:  reports that he has never smoked. He has never used smokeless tobacco. Medical:  has a past medical history of Chronic back pain and Hypertension. Surgical: Jeffery Beck  has a past surgical  history that includes Back surgery; back fusion; and 2 heart stents. Family: family history is not on file.  Constitutional Exam  General appearance: Well nourished, well developed, and well hydrated. In no apparent acute distress There were no vitals filed for this visit. BMI Assessment: Estimated body mass index is 26.88 kg/m as calculated from the following:   Height as of 11/16/21: 5' 10.5" (1.791 m).   Weight as of 11/16/21: 190 lb (86.2 kg).  BMI interpretation table: BMI level Category Range association with higher incidence of chronic pain  <18 kg/m2 Underweight   18.5-24.9 kg/m2 Ideal body weight   25-29.9 kg/m2 Overweight Increased incidence by 20%  30-34.9 kg/m2 Obese (Class I) Increased incidence by 68%  35-39.9 kg/m2 Severe obesity (Class II) Increased incidence by 136%  >40 kg/m2 Extreme obesity (Class III) Increased incidence by 254%   Patient's current BMI Ideal Body weight  There is no height or weight on file to calculate BMI. Patient weight not recorded   BMI Readings from Last 4 Encounters:  11/16/21 26.88 kg/m  06/22/19 31.57 kg/m   Wt Readings from Last 4 Encounters:  11/16/21 190 lb (86.2 kg)  06/22/19 220 lb (99.8 kg)    Psych/Mental status: Alert, oriented x 3 (person, place, & time)       Eyes: PERLA Respiratory: No evidence of acute respiratory distress  Assessment & Plan  Primary Diagnosis & Pertinent Problem List: The primary encounter diagnosis was Chronic pain syndrome. Diagnoses of Chronic neck pain (1ry area of Pain) (Bilateral) (L>R), Chronic upper extremity pain (2ry area of Pain) (Left), Chronic shoulder pain (3ry area of Pain) (Bilateral), Chronic low back pain (4th area of Pain) (Bilateral) (R>L) w/ sciatica (Bilateral) (R>L), and Chronic lower extremity pain (5th area of Pain) (Bilateral) (R>L) were also pertinent to this visit.  Visit Diagnosis: 1. Chronic pain syndrome   2. Chronic neck pain (1ry area of Pain) (Bilateral) (L>R)   3.  Chronic upper extremity pain (2ry area of Pain) (Left)   4. Chronic shoulder pain (3ry area of Pain) (Bilateral)   5. Chronic low back pain (4th area of Pain) (Bilateral) (R>L) w/ sciatica (Bilateral) (R>L)   6. Chronic lower extremity pain (5th area of Pain) (Bilateral) (R>L)    Problems updated and reviewed during this visit: No problems updated.  Plan of Care  Pharmacotherapy (Medications Ordered): No orders of the defined types were placed in this encounter.  Procedure Orders    No procedure(s) ordered today   Lab Orders  No laboratory test(s) ordered today   Imaging Orders  No imaging studies ordered today   Referral Orders  No referral(s) requested today    Pharmacological management options:  Opioid Analgesics: I will not be prescribing any opioids at this time Membrane stabilizer: I will not be prescribing any at this time Muscle relaxant: I will not be prescribing any at this time NSAID: I will not be prescribing any at this time Other analgesic(s): I will not be prescribing any at this time     Interventional Therapies  Risk  Complexity Considerations:   Estimated body mass index is 26.88 kg/m as calculated from the following:   Height as of this encounter: 5' 10.5" (1.791 m).   Weight as of this encounter: 190 lb (86.2 kg). WNL   Planned  Pending:      Under consideration:   Diagnostic left cervical ESI #1  Diagnostic bilateral cervical facet MBB #1    Completed:   None at this time   Therapeutic  Palliative (PRN) options:   None established    Provider-requested follow-up: No follow-ups on file. Recent Visits No visits were found meeting these conditions. Showing recent visits within past 90 days and meeting all other requirements Future Appointments Date Type Provider Dept  04/12/22 Appointment Milinda Pointer, MD [facility]  Showing future appointments within next 90 days and meeting all other requirements  Primary Care  Physician: [facility] Note by: Gaspar Cola, MD Date: 04/12/2022; Time: 1:09 PM

## 2022-04-12 ENCOUNTER — Ambulatory Visit (HOSPITAL_BASED_OUTPATIENT_CLINIC_OR_DEPARTMENT_OTHER): Payer: No Typology Code available for payment source | Admitting: Pain Medicine

## 2022-04-12 DIAGNOSIS — U071 COVID-19: Secondary | ICD-10-CM | POA: Insufficient documentation

## 2022-04-12 DIAGNOSIS — Z659 Problem related to unspecified psychosocial circumstances: Secondary | ICD-10-CM | POA: Insufficient documentation

## 2022-04-12 DIAGNOSIS — N529 Male erectile dysfunction, unspecified: Secondary | ICD-10-CM | POA: Insufficient documentation

## 2022-04-12 DIAGNOSIS — Z23 Encounter for immunization: Secondary | ICD-10-CM | POA: Insufficient documentation

## 2022-04-12 DIAGNOSIS — H259 Unspecified age-related cataract: Secondary | ICD-10-CM | POA: Insufficient documentation

## 2022-04-12 DIAGNOSIS — R519 Headache, unspecified: Secondary | ICD-10-CM | POA: Insufficient documentation

## 2022-04-12 DIAGNOSIS — K5190 Ulcerative colitis, unspecified, without complications: Secondary | ICD-10-CM | POA: Insufficient documentation

## 2022-04-12 DIAGNOSIS — G8929 Other chronic pain: Secondary | ICD-10-CM

## 2022-04-12 DIAGNOSIS — S43109A Unspecified dislocation of unspecified acromioclavicular joint, initial encounter: Secondary | ICD-10-CM | POA: Insufficient documentation

## 2022-04-12 DIAGNOSIS — Z7189 Other specified counseling: Secondary | ICD-10-CM | POA: Insufficient documentation

## 2022-04-12 DIAGNOSIS — M9689 Other intraoperative and postprocedural complications and disorders of the musculoskeletal system: Secondary | ICD-10-CM | POA: Insufficient documentation

## 2022-04-12 DIAGNOSIS — G5682 Other specified mononeuropathies of left upper limb: Secondary | ICD-10-CM | POA: Insufficient documentation

## 2022-04-12 DIAGNOSIS — R21 Rash and other nonspecific skin eruption: Secondary | ICD-10-CM | POA: Insufficient documentation

## 2022-04-12 DIAGNOSIS — N1832 Chronic kidney disease, stage 3b: Secondary | ICD-10-CM | POA: Insufficient documentation

## 2022-04-12 DIAGNOSIS — M79673 Pain in unspecified foot: Secondary | ICD-10-CM | POA: Insufficient documentation

## 2022-04-12 DIAGNOSIS — G894 Chronic pain syndrome: Secondary | ICD-10-CM

## 2022-04-12 DIAGNOSIS — I2510 Atherosclerotic heart disease of native coronary artery without angina pectoris: Secondary | ICD-10-CM | POA: Insufficient documentation

## 2022-04-12 DIAGNOSIS — F329 Major depressive disorder, single episode, unspecified: Secondary | ICD-10-CM | POA: Insufficient documentation

## 2022-04-12 DIAGNOSIS — E119 Type 2 diabetes mellitus without complications: Secondary | ICD-10-CM | POA: Insufficient documentation

## 2022-04-12 DIAGNOSIS — I10 Essential (primary) hypertension: Secondary | ICD-10-CM | POA: Insufficient documentation

## 2022-04-12 DIAGNOSIS — R202 Paresthesia of skin: Secondary | ICD-10-CM | POA: Insufficient documentation

## 2022-04-12 DIAGNOSIS — S7002XA Contusion of left hip, initial encounter: Secondary | ICD-10-CM | POA: Insufficient documentation

## 2022-04-12 DIAGNOSIS — M5412 Radiculopathy, cervical region: Secondary | ICD-10-CM

## 2022-04-12 DIAGNOSIS — Z91199 Patient's noncompliance with other medical treatment and regimen due to unspecified reason: Secondary | ICD-10-CM

## 2022-04-12 DIAGNOSIS — M4802 Spinal stenosis, cervical region: Secondary | ICD-10-CM | POA: Insufficient documentation

## 2022-04-12 DIAGNOSIS — M542 Cervicalgia: Secondary | ICD-10-CM

## 2022-04-12 DIAGNOSIS — G540 Brachial plexus disorders: Secondary | ICD-10-CM | POA: Insufficient documentation

## 2022-04-12 DIAGNOSIS — M5030 Other cervical disc degeneration, unspecified cervical region: Secondary | ICD-10-CM

## 2022-04-12 DIAGNOSIS — E291 Testicular hypofunction: Secondary | ICD-10-CM | POA: Insufficient documentation

## 2022-04-12 DIAGNOSIS — N189 Chronic kidney disease, unspecified: Secondary | ICD-10-CM | POA: Insufficient documentation

## 2022-04-12 DIAGNOSIS — H04129 Dry eye syndrome of unspecified lacrimal gland: Secondary | ICD-10-CM | POA: Insufficient documentation

## 2022-04-12 DIAGNOSIS — M48061 Spinal stenosis, lumbar region without neurogenic claudication: Secondary | ICD-10-CM | POA: Insufficient documentation

## 2022-04-12 DIAGNOSIS — M25519 Pain in unspecified shoulder: Secondary | ICD-10-CM | POA: Insufficient documentation

## 2022-04-12 DIAGNOSIS — R6889 Other general symptoms and signs: Secondary | ICD-10-CM | POA: Insufficient documentation

## 2022-05-12 ENCOUNTER — Other Ambulatory Visit: Payer: Self-pay

## 2022-05-12 ENCOUNTER — Emergency Department
Admission: EM | Admit: 2022-05-12 | Discharge: 2022-05-12 | Disposition: A | Discharge status: Home / Self Care | Payer: No Typology Code available for payment source | Attending: Emergency Medicine | Admitting: Emergency Medicine

## 2022-05-12 DIAGNOSIS — I2510 Atherosclerotic heart disease of native coronary artery without angina pectoris: Secondary | ICD-10-CM | POA: Insufficient documentation

## 2022-05-12 DIAGNOSIS — Z951 Presence of aortocoronary bypass graft: Secondary | ICD-10-CM | POA: Insufficient documentation

## 2022-05-12 DIAGNOSIS — R42 Dizziness and giddiness: Secondary | ICD-10-CM | POA: Diagnosis present

## 2022-05-12 DIAGNOSIS — Z20822 Contact with and (suspected) exposure to covid-19: Secondary | ICD-10-CM | POA: Diagnosis not present

## 2022-05-12 DIAGNOSIS — R531 Weakness: Secondary | ICD-10-CM

## 2022-05-12 DIAGNOSIS — E860 Dehydration: Secondary | ICD-10-CM | POA: Insufficient documentation

## 2022-05-12 LAB — COMPREHENSIVE METABOLIC PANEL
ALT: 9 U/L (ref 0–44)
AST: 24 U/L (ref 15–41)
Albumin: 3.6 g/dL (ref 3.5–5.0)
Alkaline Phosphatase: 124 U/L (ref 38–126)
Anion gap: 10 (ref 5–15)
BUN: 26 mg/dL — ABNORMAL HIGH (ref 8–23)
CO2: 23 mmol/L (ref 22–32)
Calcium: 9 mg/dL (ref 8.9–10.3)
Chloride: 104 mmol/L (ref 98–111)
Creatinine, Ser: 1.46 mg/dL — ABNORMAL HIGH (ref 0.61–1.24)
GFR, Estimated: 51 mL/min — ABNORMAL LOW (ref >60)
Glucose, Bld: 99 mg/dL (ref 70–99)
Potassium: 4.3 mmol/L (ref 3.5–5.1)
Sodium: 137 mmol/L (ref 135–145)
Total Bilirubin: 0.8 mg/dL (ref 0.3–1.2)
Total Protein: 7 g/dL (ref 6.5–8.1)

## 2022-05-12 LAB — URINALYSIS, COMPLETE (UACMP) WITH MICROSCOPIC
Bacteria, UA: NONE SEEN
Bilirubin Urine: NEGATIVE
Glucose, UA: >=500 mg/dL — AB
Hgb urine dipstick: NEGATIVE
Ketones, ur: NEGATIVE mg/dL
Leukocytes,Ua: NEGATIVE
Nitrite: NEGATIVE
Protein, ur: NEGATIVE mg/dL
Specific Gravity, Urine: 1.011 (ref 1.005–1.030)
Squamous Epithelial / HPF: NONE SEEN (ref 0–5)
pH: 5 (ref 5.0–8.0)

## 2022-05-12 LAB — CBC
HCT: 45.6 % (ref 39.0–52.0)
Hemoglobin: 14.5 g/dL (ref 13.0–17.0)
MCH: 28.6 pg (ref 26.0–34.0)
MCHC: 31.8 g/dL (ref 30.0–36.0)
MCV: 89.9 fL (ref 80.0–100.0)
Platelets: 315 10*3/uL (ref 150–400)
RBC: 5.07 MIL/uL (ref 4.22–5.81)
RDW: 15.8 % — ABNORMAL HIGH (ref 11.5–15.5)
WBC: 7.4 10*3/uL (ref 4.0–10.5)
nRBC: 0 % (ref 0.0–0.2)

## 2022-05-12 LAB — SARS CORONAVIRUS 2 BY RT PCR: SARS Coronavirus 2 by RT PCR: NEGATIVE

## 2022-05-12 LAB — TROPONIN I (HIGH SENSITIVITY)
Troponin I (High Sensitivity): 11 ng/L (ref <18)
Troponin I (High Sensitivity): 13 ng/L (ref <18)

## 2022-05-12 MED ORDER — ACETAMINOPHEN 500 MG PO TABS
1000.0000 mg | ORAL_TABLET | Freq: Once | ORAL | Status: AC
  Administered 2022-05-12: 1000 mg via ORAL
  Filled 2022-05-12: qty 2

## 2022-05-12 MED ORDER — SODIUM CHLORIDE 0.9 % IV BOLUS
1000.0000 mL | Freq: Once | INTRAVENOUS | Status: AC
  Administered 2022-05-12: 1000 mL via INTRAVENOUS

## 2022-05-12 NOTE — ED Notes (Signed)
Signature pad not working for [employer]. Pt gave verbal confirmation/consent.

## 2022-05-12 NOTE — ED Triage Notes (Signed)
Pt. To ED via EMS for near-syncope 1 hour pta. Pt. States he was putting microwave in car, felt hot, and light headed. Pt. States he has had frequent episodes of the same this week. Denies CP, SOB, or recent illness. Pt. States he ran out of his ozempic last night, he has been on it x2 months.

## 2022-05-12 NOTE — ED Provider Notes (Signed)
[facility] Provider Note    Event Date/Time   First MD Initiated Contact with Patient 05/12/22 1735     (approximate)  History   Chief Complaint: Near Syncope (Pt. To ED via EMS for near-syncope 1 hour pta. Pt. States he was putting microwave in car, felt hot, and light headed. Pt. States he has had frequent episodes of the same this week. Denies CP, SOB, or recent illness. Pt. States he ran out of his ozempic last night, he has been on it x2 months.)  HPI  Jeffery Beck is a 71 y.o. male past medical history of CAD, CABG 2 years ago, presents emergency department for dizziness and weakness.  According to the patient states he was working outside he was putting something into his car when he began feeling hot and lightheaded.  Patient states he was very hot outside today and he thinks he could be dehydrated.  States he is having some dryness in his mouth as well.  States he felt somewhat nauseated earlier today but denies any currently.  Denies any chest pain or shortness of breath at any point.  No vomiting or diarrhea.  No fever.  Patient does state he is stopping Ozempic which he relates some of his symptoms to as well.   Physical Exam   Triage Vital Signs: ED Triage Vitals  Enc Vitals Group     BP --      Pulse Rate 05/12/22 1733 84     Resp 05/12/22 1733 18     Temp 05/12/22 1733 98.3 F (36.8 C)     Temp Source 05/12/22 1733 Oral     SpO2 05/12/22 1733 98 %     Weight 05/12/22 1734 182 lb (82.6 kg)     Height 05/12/22 1734 5\' 10"  (1.778 m)     Head Circumference --      Peak Flow --      Pain Score 05/12/22 1734 7     Pain Loc --      Pain Edu? --      Excl. in GC? --     Most recent vital signs: Vitals:   05/12/22 1733  Pulse: 84  Resp: 18  Temp: 98.3 F (36.8 C)  SpO2: 98%    General: Awake, no distress.  CV:  Good peripheral perfusion.  Regular rate and rhythm  Resp:  Normal effort.  Equal breath sounds bilaterally.  Abd:  No  distention.  Soft, nontender.  No rebound or guarding.   ED Results / Procedures / Treatments   EKG  EKG viewed and interpreted by myself shows a normal sinus rhythm 88 bpm with a widened QRS, left axis deviation, largely normal intervals with no concerning ST changes.   MEDICATIONS ORDERED IN ED: Medications  sodium chloride 0.9 % bolus 1,000 mL (has no administration in time range)     IMPRESSION / MDM / ASSESSMENT AND PLAN / ED COURSE  I reviewed the triage vital signs and the nursing notes.  Patient's presentation is most consistent with acute presentation with potential threat to life or bodily function.  Patient presents emergency department for weakness and dizziness after working outside in the heat today.  Patient states this has been an intermittent issue over the past week or so but only seems to occur when he is working outside in the heat.  Patient believes he could be dehydrated and states he has had a dry mouth today as well.  Differential would  include dehydration, metabolic or electrolyte abnormality such as acute kidney injury, ACS, UTI, COVID.  We will check labs urinalysis and COVID swab.  We will IV hydrate and obtain an EKG.  Patient agreeable to plan of care.  Patient's work-up is so far reassuring, reassuring EKG, chemistry does show mild renal insufficiency otherwise reassuring, we are IV hydrating the patient.  Troponin is negative x2.  COVID is negative.  Urinalysis pending.  Patient's work-up is reassuring.  Urinalysis is normal including no ketones but does show increased glucose.  Discussed with the patient increased not sugary fluids/water over the weekend and to follow-up with his doctor this coming week for recheck I also discussed avoiding the heat.  Patient agreeable to plan of care.  FINAL CLINICAL IMPRESSION(S) / ED DIAGNOSES   Dehydration   Note:  This document was prepared using Dragon voice recognition software and may include unintentional  dictation errors.   Minna Antis, MD 05/12/22 2024

## 2022-05-12 NOTE — Discharge Instructions (Addendum)
Please drink plenty of fluids over the weekend.  Please follow-up with your doctor this coming week for recheck/reevaluation.  Return to the emergency department for any symptom personally concerning to yourself.

## 2022-05-29 ENCOUNTER — Other Ambulatory Visit: Payer: Self-pay

## 2022-05-29 ENCOUNTER — Emergency Department
Admission: EM | Admit: 2022-05-29 | Discharge: 2022-05-29 | Disposition: A | Discharge status: Home / Self Care | Payer: No Typology Code available for payment source | Attending: Emergency Medicine | Admitting: Emergency Medicine

## 2022-05-29 ENCOUNTER — Encounter: Payer: Self-pay | Admitting: Intensive Care

## 2022-05-29 ENCOUNTER — Emergency Department: Payer: No Typology Code available for payment source

## 2022-05-29 DIAGNOSIS — Z951 Presence of aortocoronary bypass graft: Secondary | ICD-10-CM | POA: Diagnosis not present

## 2022-05-29 DIAGNOSIS — R109 Unspecified abdominal pain: Secondary | ICD-10-CM | POA: Diagnosis present

## 2022-05-29 DIAGNOSIS — R944 Abnormal results of kidney function studies: Secondary | ICD-10-CM | POA: Insufficient documentation

## 2022-05-29 DIAGNOSIS — R7401 Elevation of levels of liver transaminase levels: Secondary | ICD-10-CM | POA: Diagnosis not present

## 2022-05-29 DIAGNOSIS — K5900 Constipation, unspecified: Secondary | ICD-10-CM | POA: Diagnosis not present

## 2022-05-29 DIAGNOSIS — I2510 Atherosclerotic heart disease of native coronary artery without angina pectoris: Secondary | ICD-10-CM | POA: Diagnosis not present

## 2022-05-29 HISTORY — DX: Unspecified osteoarthritis, unspecified site: M19.90

## 2022-05-29 LAB — URINALYSIS, ROUTINE W REFLEX MICROSCOPIC
Bacteria, UA: NONE SEEN
Bilirubin Urine: NEGATIVE
Glucose, UA: >=500 mg/dL — AB
Hgb urine dipstick: NEGATIVE
Ketones, ur: 20 mg/dL — AB
Leukocytes,Ua: NEGATIVE
Nitrite: NEGATIVE
Protein, ur: NEGATIVE mg/dL
Specific Gravity, Urine: 1.022 (ref 1.005–1.030)
Squamous Epithelial / HPF: NONE SEEN (ref 0–5)
pH: 6 (ref 5.0–8.0)

## 2022-05-29 LAB — COMPREHENSIVE METABOLIC PANEL
ALT: 12 U/L (ref 0–44)
AST: 24 U/L (ref 15–41)
Albumin: 4 g/dL (ref 3.5–5.0)
Alkaline Phosphatase: 133 U/L — ABNORMAL HIGH (ref 38–126)
Anion gap: 10 (ref 5–15)
BUN: 22 mg/dL (ref 8–23)
CO2: 27 mmol/L (ref 22–32)
Calcium: 9.3 mg/dL (ref 8.9–10.3)
Chloride: 101 mmol/L (ref 98–111)
Creatinine, Ser: 1.34 mg/dL — ABNORMAL HIGH (ref 0.61–1.24)
GFR, Estimated: 57 mL/min — ABNORMAL LOW (ref >60)
Glucose, Bld: 105 mg/dL — ABNORMAL HIGH (ref 70–99)
Potassium: 4.5 mmol/L (ref 3.5–5.1)
Sodium: 138 mmol/L (ref 135–145)
Total Bilirubin: 1.6 mg/dL — ABNORMAL HIGH (ref 0.3–1.2)
Total Protein: 7.8 g/dL (ref 6.5–8.1)

## 2022-05-29 LAB — CBC
HCT: 47.4 % (ref 39.0–52.0)
Hemoglobin: 14.9 g/dL (ref 13.0–17.0)
MCH: 28.4 pg (ref 26.0–34.0)
MCHC: 31.4 g/dL (ref 30.0–36.0)
MCV: 90.5 fL (ref 80.0–100.0)
Platelets: 338 10*3/uL (ref 150–400)
RBC: 5.24 MIL/uL (ref 4.22–5.81)
RDW: 15.6 % — ABNORMAL HIGH (ref 11.5–15.5)
WBC: 9.5 10*3/uL (ref 4.0–10.5)
nRBC: 0 % (ref 0.0–0.2)

## 2022-05-29 LAB — LIPASE, BLOOD: Lipase: 26 U/L (ref 11–51)

## 2022-05-29 MED ORDER — PANTOPRAZOLE SODIUM 20 MG PO TBEC: 20.0000 mg | DELAYED_RELEASE_TABLET | Freq: Every day | ORAL | 0 refills | Status: DC

## 2022-05-29 MED ORDER — MINERAL OIL RE ENEM
1.0000 | ENEMA | Freq: Once | RECTAL | Status: AC
Start: 2022-05-29 — End: 2022-05-29
  Administered 2022-05-29: 1 via RECTAL

## 2022-05-29 MED ORDER — SODIUM CHLORIDE 0.9 % IV BOLUS
500.0000 mL | Freq: Once | INTRAVENOUS | Status: AC
  Administered 2022-05-29: 500 mL via INTRAVENOUS

## 2022-05-29 MED ORDER — IOHEXOL 300 MG/ML  SOLN
100.0000 mL | Freq: Once | INTRAMUSCULAR | Status: AC | PRN
  Administered 2022-05-29: 100 mL via INTRAVENOUS

## 2022-05-29 NOTE — ED Provider Notes (Signed)
[facility] Provider Note    Event Date/Time   First MD Initiated Contact with Patient 05/29/22 1503     (approximate)   History   Abdominal Pain   HPI  Jeffery Beck is a 71 y.o. male  with CAD, CABG who comes in with abdominal discomfort.  Patient reports worsening abdominal discomfort over the past few days.  He states that he has not been able to have a bowel movement and has a lot of pressure in his rectum area.  He states he been taking senna at home without relief.  He reports that he feels like he might need a enema.  He denies any chest pain, shortness of breath, urinary symptoms or other concerns.  He reports I do not think I have any colon cancer but he reports not having a colonoscopy for a few years.   Physical Exam   Triage Vital Signs: ED Triage Vitals  Enc Vitals Group     BP 05/29/22 1407 (!) 152/94     Pulse Rate 05/29/22 1407 97     Resp 05/29/22 1407 16     Temp 05/29/22 1407 98.2 F (36.8 C)     Temp Source 05/29/22 1407 Oral     SpO2 05/29/22 1407 99 %     Weight 05/29/22 1409 182 lb (82.6 kg)     Height 05/29/22 1409 _0  (1.778 m)     Head Circumference --      Peak Flow --      Pain Score 05/29/22 1409 10     Pain Loc --      Pain Edu? --      Excl. in [place]? --     Most recent vital signs: Vitals:   05/29/22 1407  BP: (!) 152/94  Pulse: 97  Resp: 16  Temp: 98.2 F (36.8 C)  SpO2: 99%     General: Awake, no distress.  CV:  Good peripheral perfusion.  Resp:  Normal effort.  Abd:  No distention.  Slightly tender in the lower abdomen Other:  Stool noted in the vault   ED Results / Procedures / Treatments   Labs (all labs ordered are listed, but only abnormal results are displayed) Labs Reviewed  COMPREHENSIVE METABOLIC PANEL - Abnormal; Notable for the following components:      Result Value   Glucose, Bld 105 (*)    Creatinine, Ser 1.34 (*)    Alkaline Phosphatase 133 (*)    Total Bilirubin 1.6 (*)     GFR, Estimated 57 (*)    All other components within normal limits  CBC - Abnormal; Notable for the following components:   RDW 15.6 (*)    All other components within normal limits  LIPASE, BLOOD  URINALYSIS, ROUTINE W REFLEX MICROSCOPIC      RADIOLOGY I have reviewed the CT personally and interpreted patient has stool noted but no evidence of any obvious obstruction.  PROCEDURES:  Critical Care performed: No  Procedures  ------------------------------------------------------------------------------------------------------------------- Fecal Disimpaction Procedure Note:  Performed by me:  Patient placed in the lateral recumbent position with knees drawn towards chest. Nurse present for patient support. small amount of hard brown stool removed. No complications during procedure.   ------------------------------------------------------------------------------------------------------------------  MEDICATIONS ORDERED IN ED: Medications  mineral oil enema 1 enema (1 enema Rectal Given 05/29/22 1631)  sodium chloride 0.9 % bolus 500 mL (500 mLs Intravenous New Bag/Given 05/29/22 1805)  iohexol (OMNIPAQUE) 300 MG/ML solution 100 mL (100 mLs  Intravenous Contrast Given 05/29/22 1748)     IMPRESSION / MDM / ASSESSMENT AND PLAN / ED COURSE  I reviewed the triage vital signs and the nursing notes.   Patient's presentation is most consistent with acute presentation with potential threat to life or bodily function.   Suspect this is most likely constipation and stool impaction given rectal exam however given patient's age and comorbidities we discussed pros and cons of CT imaging and he would like to proceed with CT to rule out any other evidence of obstruction, mass or other acute pathology that could be contributing to constipation.  CBC reassuring lipase normal CMP slightly elevated creatinine but stable from baseline.  T. bili is slightly elevated prior to baseline.  Alk phos  slightly elevated but similar to baseline  Discussed with patient CT results and need to follow-up with GI for colonoscopy patient expressed understanding.  He has no evidence of UTI and no evidence of urinary retention.  Patient had a good amount of stool out with enema.  1. Pre pyloric gastric wall thickening, can be seen with gastritis or peptic ulcer disease. Recommend correlation with endoscopy. 2. Moderate to large volume of stool throughout the colon with fecalization of small bowel contents, suggesting slow transit/constipation. No bowel obstruction or inflammatory change. 3. Colonic diverticulosis without diverticulitis. 4. A 2.3 cm segment of nondistended sigmoid colon is interspersed between stool distended colon. While this may represent area of peristalsis/nondistention, consider correlation with colonoscopy to exclude underlying colonic mass. 5. Enlarged prostate gland with mild bladder wall thickening. Favor chronic bladder outlet obstruction. 6. Mild hepatic steatosis.  Updated patient on CT results and provided copy of report.  He expressed understanding with the need to follow-up with GI.  Patient does see doctors at the [place] but have also given him our GI number in case he prefers to follow-up here.  We discussed MiraLAX to help with constipation he already has senna that he is taking at home.  At this time he feels comfortable with discharge home.  No significant upper abdominal pain but will discharge on PPI to be cautious given CT findings.    The patient is on the cardiac monitor to evaluate for evidence of arrhythmia and/or significant heart rate changes.      FINAL CLINICAL IMPRESSION(S) / ED DIAGNOSES   Final diagnoses:  Constipation, unspecified constipation type     Rx / DC Orders   ED Discharge Orders          Ordered    pantoprazole (PROTONIX) 20 MG tablet  Daily        05/29/22 1905             Note:  This document was prepared using  Dragon voice recognition software and may include unintentional dictation errors.   [name], MD 05/29/22 [phone]

## 2022-05-29 NOTE — ED Notes (Signed)
Pt with mod to large BM. Edp notified.

## 2022-05-29 NOTE — ED Notes (Signed)
E-signature not working at this time. Pt verbalized understanding of D/C instructions, prescriptions and follow up care with no further questions at this time. Pt in NAD and ambulatory at time of D/C.  

## 2022-05-29 NOTE — Discharge Instructions (Addendum)
You can take MiraLAX he can take a capful every 2 hours for 4 doses until you produce a bowel movement.  When he started having bowel movements you can go down to just taking 1 capful every day.  When you are using the tramadol is going to cause constipation.  You need to call GI to make a follow-up appointment to discuss endoscopy and colonoscopy.  Return to the ER if you develop worsening pain, fevers or any other concerns   1. Pre pyloric gastric wall thickening, can be seen with gastritis  or peptic ulcer disease. Recommend correlation with endoscopy.  2. Moderate to large volume of stool throughout the colon with  fecalization of small bowel contents, suggesting slow  transit/constipation. No bowel obstruction or inflammatory change.  3. Colonic diverticulosis without diverticulitis.  4. A 2.3 cm segment of nondistended sigmoid colon is interspersed  between stool distended colon. While this may represent area of  peristalsis/nondistention, consider correlation with colonoscopy to  exclude underlying colonic mass.  5. Enlarged prostate gland with mild bladder wall thickening. Favor  chronic bladder outlet obstruction.  6. Mild hepatic steatosis.

## 2022-05-29 NOTE — ED Triage Notes (Signed)
Patient c/o abdominal pain for over a year. Denies emesis. Reports unable to eat due to stomach pain. Last BM X5 days ago. Reports he has had constipation issues for years. Takes tramadol daily.   Hx osteoarthritis

## 2022-06-01 ENCOUNTER — Emergency Department
Admission: EM | Admit: 2022-06-01 | Discharge: 2022-06-02 | Disposition: A | Discharge status: Other Acute Inpatient Hospital | Payer: No Typology Code available for payment source | Attending: Emergency Medicine | Admitting: Emergency Medicine

## 2022-06-01 DIAGNOSIS — F329 Major depressive disorder, single episode, unspecified: Secondary | ICD-10-CM | POA: Diagnosis present

## 2022-06-01 DIAGNOSIS — K5190 Ulcerative colitis, unspecified, without complications: Secondary | ICD-10-CM | POA: Diagnosis present

## 2022-06-01 DIAGNOSIS — Z20822 Contact with and (suspected) exposure to covid-19: Secondary | ICD-10-CM | POA: Diagnosis not present

## 2022-06-01 DIAGNOSIS — F29 Unspecified psychosis not due to a substance or known physiological condition: Secondary | ICD-10-CM | POA: Insufficient documentation

## 2022-06-01 DIAGNOSIS — N189 Chronic kidney disease, unspecified: Secondary | ICD-10-CM | POA: Diagnosis present

## 2022-06-01 DIAGNOSIS — F23 Brief psychotic disorder: Secondary | ICD-10-CM

## 2022-06-01 DIAGNOSIS — M5030 Other cervical disc degeneration, unspecified cervical region: Secondary | ICD-10-CM | POA: Diagnosis not present

## 2022-06-01 DIAGNOSIS — R410 Disorientation, unspecified: Secondary | ICD-10-CM | POA: Diagnosis not present

## 2022-06-01 DIAGNOSIS — I129 Hypertensive chronic kidney disease with stage 1 through stage 4 chronic kidney disease, or unspecified chronic kidney disease: Secondary | ICD-10-CM | POA: Diagnosis not present

## 2022-06-01 DIAGNOSIS — F0781 Postconcussional syndrome: Secondary | ICD-10-CM | POA: Diagnosis present

## 2022-06-01 DIAGNOSIS — N1832 Chronic kidney disease, stage 3b: Secondary | ICD-10-CM | POA: Diagnosis present

## 2022-06-01 DIAGNOSIS — M79673 Pain in unspecified foot: Secondary | ICD-10-CM | POA: Diagnosis present

## 2022-06-01 DIAGNOSIS — E1122 Type 2 diabetes mellitus with diabetic chronic kidney disease: Secondary | ICD-10-CM | POA: Diagnosis not present

## 2022-06-01 DIAGNOSIS — F333 Major depressive disorder, recurrent, severe with psychotic symptoms: Secondary | ICD-10-CM | POA: Insufficient documentation

## 2022-06-01 DIAGNOSIS — E119 Type 2 diabetes mellitus without complications: Secondary | ICD-10-CM

## 2022-06-01 DIAGNOSIS — F39 Unspecified mood [affective] disorder: Secondary | ICD-10-CM | POA: Diagnosis present

## 2022-06-01 DIAGNOSIS — G894 Chronic pain syndrome: Secondary | ICD-10-CM | POA: Diagnosis not present

## 2022-06-01 LAB — CBC WITH DIFFERENTIAL/PLATELET
Abs Immature Granulocytes: 0.02 10*3/uL (ref 0.00–0.07)
Basophils Absolute: 0 10*3/uL (ref 0.0–0.1)
Basophils Relative: 1 %
Eosinophils Absolute: 0.2 10*3/uL (ref 0.0–0.5)
Eosinophils Relative: 2 %
HCT: 46.5 % (ref 39.0–52.0)
Hemoglobin: 14.6 g/dL (ref 13.0–17.0)
Immature Granulocytes: 0 %
Lymphocytes Relative: 21 %
Lymphs Abs: 1.8 10*3/uL (ref 0.7–4.0)
MCH: 28.3 pg (ref 26.0–34.0)
MCHC: 31.4 g/dL (ref 30.0–36.0)
MCV: 90.1 fL (ref 80.0–100.0)
Monocytes Absolute: 0.7 10*3/uL (ref 0.1–1.0)
Monocytes Relative: 8 %
Neutro Abs: 5.9 10*3/uL (ref 1.7–7.7)
Neutrophils Relative %: 68 %
Platelets: 320 10*3/uL (ref 150–400)
RBC: 5.16 MIL/uL (ref 4.22–5.81)
RDW: 15.3 % (ref 11.5–15.5)
WBC: 8.7 10*3/uL (ref 4.0–10.5)
nRBC: 0 % (ref 0.0–0.2)

## 2022-06-01 LAB — COMPREHENSIVE METABOLIC PANEL
ALT: 13 U/L (ref 0–44)
AST: 28 U/L (ref 15–41)
Albumin: 3.9 g/dL (ref 3.5–5.0)
Alkaline Phosphatase: 127 U/L — ABNORMAL HIGH (ref 38–126)
Anion gap: 11 (ref 5–15)
BUN: 20 mg/dL (ref 8–23)
CO2: 25 mmol/L (ref 22–32)
Calcium: 9.5 mg/dL (ref 8.9–10.3)
Chloride: 102 mmol/L (ref 98–111)
Creatinine, Ser: 1.45 mg/dL — ABNORMAL HIGH (ref 0.61–1.24)
GFR, Estimated: 52 mL/min — ABNORMAL LOW (ref >60)
Glucose, Bld: 112 mg/dL — ABNORMAL HIGH (ref 70–99)
Potassium: 4.3 mmol/L (ref 3.5–5.1)
Sodium: 138 mmol/L (ref 135–145)
Total Bilirubin: 0.8 mg/dL (ref 0.3–1.2)
Total Protein: 7.5 g/dL (ref 6.5–8.1)

## 2022-06-01 LAB — URINE DRUG SCREEN, QUALITATIVE (ARMC ONLY)
Amphetamines, Ur Screen: NOT DETECTED
Barbiturates, Ur Screen: NOT DETECTED
Benzodiazepine, Ur Scrn: NOT DETECTED
Cannabinoid 50 Ng, Ur ~~LOC~~: NOT DETECTED
Cocaine Metabolite,Ur ~~LOC~~: NOT DETECTED
MDMA (Ecstasy)Ur Screen: NOT DETECTED
Methadone Scn, Ur: NOT DETECTED
Opiate, Ur Screen: NOT DETECTED
Phencyclidine (PCP) Ur S: NOT DETECTED
Tricyclic, Ur Screen: POSITIVE — AB

## 2022-06-01 LAB — RESP PANEL BY RT-PCR (FLU A&B, COVID) ARPGX2
Influenza A by PCR: NEGATIVE
Influenza B by PCR: NEGATIVE
SARS Coronavirus 2 by RT PCR: NEGATIVE

## 2022-06-01 LAB — ETHANOL: Alcohol, Ethyl (B): <10 mg/dL (ref <10)

## 2022-06-01 NOTE — ED Provider Notes (Signed)
[facility] Provider Note    Event Date/Time   First MD Initiated Contact with Patient 06/01/22 1925     (approximate)   History   Chief Complaint: Mental Health Problem   HPI  Jeffery Beck is a 71 y.o. male with a past history of hypertension, diabetes, chronic pain, depression, CKD who comes to the ED requesting pencils paper and a phone so that he can call Du[facility] [place].  He reports that he is trying to be a [place] for [employer], but "they" are trying to stop him.  He feels paranoid and persecuted.  Denies any recent illness or trauma or acute pain complaints.     Physical Exam   Triage Vital Signs: ED Triage Vitals  Enc Vitals Group     BP 06/01/22 1933 (!) 173/100     Pulse Rate 06/01/22 1933 88     Resp 06/01/22 1933 18     Temp 06/01/22 1933 98.4 F (36.9 C)     Temp Source 06/01/22 1933 Oral     SpO2 06/01/22 1933 99 %     Weight 06/01/22 1934 175 lb (79.4 kg)     Height 06/01/22 1934 5\' 10"  (1.778 m)     Head Circumference --      Peak Flow --      Pain Score 06/01/22 1941 0     Pain Loc --      Pain Edu? --      Excl. in GC? --     Most recent vital signs: Vitals:   06/01/22 1933  BP: (!) 173/100  Pulse: 88  Resp: 18  Temp: 98.4 F (36.9 C)  SpO2: 99%    General: Awake, no distress.  Ambulatory. CV:  Good peripheral perfusion.  Resp:  Normal effort.  Abd:  No distention.     ED Results / Procedures / Treatments   Labs (all labs ordered are listed, but only abnormal results are displayed) Labs Reviewed  COMPREHENSIVE METABOLIC PANEL - Abnormal; Notable for the following components:      Result Value   Glucose, Bld 112 (*)    Creatinine, Ser 1.45 (*)    Alkaline Phosphatase 127 (*)    GFR, Estimated 52 (*)    All other components within normal limits  RESP PANEL BY RT-PCR (FLU A&B, COVID) ARPGX2  ETHANOL  CBC WITH DIFFERENTIAL/PLATELET  URINE DRUG SCREEN, QUALITATIVE ([facility] ONLY)      EKG    RADIOLOGY    PROCEDURES:  Procedures   MEDICATIONS ORDERED IN ED: Medications - No data to display   IMPRESSION / MDM / ASSESSMENT AND PLAN / ED COURSE  I reviewed the triage vital signs and the nursing notes.                              Differential diagnosis includes, but is not limited to, electrolyte abnormality, COVID, toxidrome, acute psychosis  Patient's presentation is most consistent with acute presentation with potential threat to life or bodily function.  Patient presents with psychotic symptoms.  He is disorganized, disoriented, and delusional.  No acute medical complaints.  Vital signs and exam are unremarkable.  He is medically stable.  Will IVC for safety pending psychiatry evaluation.  The patient has been placed in psychiatric observation due to the need to provide a safe environment for the patient while obtaining psychiatric consultation and evaluation, as well as ongoing medical and  medication management to treat the patient's condition.  The patient has been placed under full IVC at this time.        FINAL CLINICAL IMPRESSION(S) / ED DIAGNOSES   Final diagnoses:  Acute psychosis (HCC)     Rx / DC Orders   ED Discharge Orders     None        Note:  This document was prepared using Dragon voice recognition software and may include unintentional dictation errors.   Sharman Cheek, MD 06/01/22 2332

## 2022-06-01 NOTE — ED Triage Notes (Addendum)
Pt states hes coming from working outside. Pt rambling about Joe biden, religion/church, etc. Not making sense at this time. Pt cooperative

## 2022-06-01 NOTE — Consult Note (Signed)
[place] [facility] Consult   Reason for Consult:Psychiatric Evaluation Referring Physician: Dr. Joni Fears Patient Identification: Jeffery Beck MRN:  [id number] Principal Diagnosis: <principal problem not specified> Diagnosis:  Active Problems:   Post concussion syndrome   Chronic pain syndrome   DDD (degenerative disc disease), cervical   Diabetes mellitus ([place])   Controlled type 2 diabetes mellitus without complication ([place])   Major depression   Ulcerative colitis ([place])   Chronic kidney disease   Chronic kidney disease, stage 3b (HCC)   Type 2 diabetes mellitus without complications ([place])   Pain in unspecified foot   Total Time spent with patient: 1 hour  Subjective: "I am not speaking to your until I talk to [place]."  Jeffery Beck is a 71 y.o. male patient presented to [facility] ED via POV under involuntary commitment status (IVC). The patient was seen in our ED about three days ago. After reading the patient assessment, it sounded like he was doing well. The patient is delusional and paranoid and is not quite doing well mentally. The patient is easily angered; he believes President Barbette Or is in the camera in his room. He was talking to the wall while the TV was on. The patient denied A/V/H and was angered when asked about these assessments. This provider saw The patient face-to-face; the chart was reviewed, and consulted with Dr.Stafford on 06/01/2022 due to the patient's care. It was discussed with the EDP that the patient does meet the criteria to be admitted to the geriatric-psychiatric inpatient unit.   On evaluation, the patient is alert and oriented x 1, anxious, irated, uncooperative, and mood-congruent with affect. The patient does appear to be responding to internal and external stimuli. The patient is presenting with some delusional thinking. The patient denies auditory or visual hallucinations. The patient denies any suicidal, homicidal, or self-harm ideations. The  patient is presenting with some psychotic and paranoid behaviors. During an encounter with the patient, he could not answer questions appropriately.   HPI:   Past Psychiatric History: History reviewed. No pertinent past psychiatric history  Risk to Self:   Risk to Others:   Prior Inpatient Therapy:   Prior Outpatient Therapy:    Past Medical History:  Past Medical History:  Diagnosis Date   Chronic back pain    Hypertension    Osteoarthritis     Past Surgical History:  Procedure Laterality Date   2 heart stents     back fusion     BACK SURGERY     trial back stimulator for chronic back pain   Family History: No family history on file. Family Psychiatric  History:  Social History:  Social History   Substance and Sexual Activity  Alcohol Use Not Currently     Social History   Substance and Sexual Activity  Drug Use Yes   Comment: prescribed tramadol    Social History   Socioeconomic History   Marital status: Divorced    Spouse name: Not on file   Number of children: Not on file   Years of education: Not on file   Highest education level: Not on file  Occupational History   Not on file  Tobacco Use   Smoking status: Former    Types: Cigarettes   Smokeless tobacco: Never  Vaping Use   Vaping Use: Never used  Substance and Sexual Activity   Alcohol use: Not Currently   Drug use: Yes    Comment: prescribed tramadol   Sexual activity: Not on file  Other Topics Concern   Not on file  Social History Narrative   Not on file   Social Determinants of Health   Financial Resource Strain: Not on file  Food Insecurity: Not on file  Transportation Needs: Not on file  Physical Activity: Not on file  Stress: Not on file  Social Connections: Not on file   Additional Social History:    Allergies:  No Known Allergies  Labs:  Results for orders placed or performed during the hospital encounter of 06/01/22 (from the past 48 hour(s))  Comprehensive metabolic  panel     Status: Abnormal   Collection Time: 06/01/22  7:21 PM  Result Value Ref Range   Sodium 138 135 - 145 mmol/L   Potassium 4.3 3.5 - 5.1 mmol/L   Chloride 102 98 - 111 mmol/L   CO2 25 22 - 32 mmol/L   Glucose, Bld 112 (H) 70 - 99 mg/dL    Comment: Glucose reference range applies only to samples taken after fasting for at least 8 hours.   BUN 20 8 - 23 mg/dL   Creatinine, Ser 1.45 (H) 0.61 - 1.24 mg/dL   Calcium 9.5 8.9 - 10.3 mg/dL   Total Protein 7.5 6.5 - 8.1 g/dL   Albumin 3.9 3.5 - 5.0 g/dL   AST 28 15 - 41 U/L   ALT 13 0 - 44 U/L   Alkaline Phosphatase 127 (H) 38 - 126 U/L   Total Bilirubin 0.8 0.3 - 1.2 mg/dL   GFR, Estimated 52 (L) >60 mL/min    Comment: (NOTE) Calculated using the CKD-EPI Creatinine Equation (2021)    Anion gap 11 5 - 15    Comment: Performed at [facility], [address] [place], [place] [address]  Ethanol     Status: None   Collection Time: 06/01/22  7:21 PM  Result Value Ref Range   Alcohol, Ethyl (B) <10 <10 mg/dL    Comment: (NOTE) Lowest detectable limit for serum alcohol is 10 mg/dL.  For medical purposes only. Performed at [facility], [address] [place], [place] [address]   Urine Drug Screen, Qualitative     Status: Abnormal   Collection Time: 06/01/22  7:21 PM  Result Value Ref Range   Tricyclic, Ur Screen POSITIVE (A) NONE DETECTED   Amphetamines, Ur Screen NONE DETECTED NONE DETECTED   MDMA (Ecstasy)Ur Screen NONE DETECTED NONE DETECTED   Cocaine Metabolite,Ur [place] NONE DETECTED NONE DETECTED   Opiate, Ur Screen NONE DETECTED NONE DETECTED   Phencyclidine (PCP) Ur S NONE DETECTED NONE DETECTED   Cannabinoid 50 Ng, Ur [place] NONE DETECTED NONE DETECTED   Barbiturates, Ur Screen NONE DETECTED NONE DETECTED   Benzodiazepine, Ur Scrn NONE DETECTED NONE DETECTED   Methadone Scn, Ur NONE DETECTED NONE DETECTED    Comment: (NOTE) Tricyclics + metabolites, urine    Cutoff 1000 ng/mL Amphetamines +  metabolites, urine  Cutoff 1000 ng/mL MDMA (Ecstasy), urine              Cutoff 500 ng/mL Cocaine Metabolite, urine          Cutoff 300 ng/mL Opiate + metabolites, urine        Cutoff 300 ng/mL Phencyclidine (PCP), urine         Cutoff 25 ng/mL Cannabinoid, urine                 Cutoff 50 ng/mL Barbiturates + metabolites, urine  Cutoff 200 ng/mL Benzodiazepine, urine  Cutoff 200 ng/mL Methadone, urine                   Cutoff 300 ng/mL  The urine drug screen provides only a preliminary, unconfirmed analytical test result and should not be used for non-medical purposes. Clinical consideration and professional judgment should be applied to any positive drug screen result due to possible interfering substances. A more specific alternate chemical method must be used in order to obtain a confirmed analytical result. Gas chromatography / mass spectrometry (GC/MS) is the preferred confirm atory method. Performed at [facility], [address] [place], [place] [address]   CBC with Diff     Status: None   Collection Time: 06/01/22  7:21 PM  Result Value Ref Range   WBC 8.7 4.0 - 10.5 K/uL   RBC 5.16 4.22 - 5.81 MIL/uL   Hemoglobin 14.6 13.0 - 17.0 g/dL   HCT 60.4 54.0 - 98.1 %   MCV 90.1 80.0 - 100.0 fL   MCH 28.3 26.0 - 34.0 pg   MCHC 31.4 30.0 - 36.0 g/dL   RDW 19.1 47.8 - 29.5 %   Platelets 320 150 - 400 K/uL   nRBC 0.0 0.0 - 0.2 %   Neutrophils Relative % 68 %   Neutro Abs 5.9 1.7 - 7.7 K/uL   Lymphocytes Relative 21 %   Lymphs Abs 1.8 0.7 - 4.0 K/uL   Monocytes Relative 8 %   Monocytes Absolute 0.7 0.1 - 1.0 K/uL   Eosinophils Relative 2 %   Eosinophils Absolute 0.2 0.0 - 0.5 K/uL   Basophils Relative 1 %   Basophils Absolute 0.0 0.0 - 0.1 K/uL   Immature Granulocytes 0 %   Abs Immature Granulocytes 0.02 0.00 - 0.07 K/uL    Comment: Performed at [facility], [address] [place], [place] [address]  Resp Panel by RT-PCR (Flu A&B, Covid)  Anterior Nasal Swab     Status: None   Collection Time: 06/01/22  7:22 PM   Specimen: Anterior Nasal Swab  Result Value Ref Range   SARS Coronavirus 2 by RT PCR NEGATIVE NEGATIVE    Comment: (NOTE) SARS-CoV-2 target nucleic acids are NOT DETECTED.  The SARS-CoV-2 RNA is generally detectable in upper respiratory specimens during the acute phase of infection. The lowest concentration of SARS-CoV-2 viral copies this assay can detect is 138 copies/mL. A negative result does not preclude SARS-Cov-2 infection and should not be used as the sole basis for treatment or other patient management decisions. A negative result may occur with  improper specimen collection/handling, submission of specimen other than nasopharyngeal swab, presence of viral mutation(s) within the areas targeted by this assay, and inadequate number of viral copies(<138 copies/mL). A negative result must be combined with clinical observations, patient history, and epidemiological information. The expected result is Negative.  Fact Sheet for Patients:  BloggerCourse.com  Fact Sheet for Healthcare Providers:  SeriousBroker.it  This test is no t yet approved or cleared by the [place] FDA and  has been authorized for detection and/or diagnosis of SARS-CoV-2 by FDA under an Emergency Use Authorization (EUA). This EUA will remain  in effect (meaning this test can be used) for the duration of the COVID-19 declaration under Section 564(b)(1) of the Act, 21 U.S.C.section 360bbb-3(b)(1), unless the authorization is terminated  or revoked sooner.       Influenza A by PCR NEGATIVE NEGATIVE   Influenza B by PCR NEGATIVE NEGATIVE    Comment: (NOTE) The  Xpert Xpress SARS-CoV-2/FLU/RSV plus assay is intended as an aid in the diagnosis of influenza from Nasopharyngeal swab specimens and should not be used as a sole basis for treatment. Nasal washings and aspirates are  unacceptable for Xpert Xpress SARS-CoV-2/FLU/RSV testing.  Fact Sheet for Patients: EntrepreneurPulse.com.au  Fact Sheet for Healthcare Providers: IncredibleEmployment.be  This test is not yet approved or cleared by the [place] FDA and has been authorized for detection and/or diagnosis of SARS-CoV-2 by FDA under an Emergency Use Authorization (EUA). This EUA will remain in effect (meaning this test can be used) for the duration of the COVID-19 declaration under Section 564(b)(1) of the Act, 21 U.S.C. section 360bbb-3(b)(1), unless the authorization is terminated or revoked.  Performed at [facility], [address] [place], [place] [address]     No current facility-administered medications for this encounter.   Current Outpatient Medications  Medication Sig Dispense Refill   atorvastatin (LIPITOR) 10 MG tablet Take by mouth.     dapagliflozin propanediol (FARXIGA) 5 MG TABS tablet Take by mouth.     Insulin Glargine (LANTUS [place]) Inject 22 Units into the skin at bedtime.     LISINOPRIL PO Take by mouth.     MIRTAZAPINE PO Take 1 tablet by mouth at bedtime.     nortriptyline (PAMELOR) 10 MG capsule Take 10 mg by mouth at bedtime.     pantoprazole (PROTONIX) 20 MG tablet Take 1 tablet (20 mg total) by mouth daily for 14 days. 14 tablet 0   pregabalin (LYRICA) 75 MG capsule Take 75 mg by mouth at bedtime.     Semaglutide (OZEMPIC, 1 MG/DOSE, [place]) Inject 1 mg into the skin once a week.     traMADol (ULTRAM) 50 MG tablet Take 50 mg by mouth as needed.     traZODone (DESYREL) 100 MG tablet Take 100 mg by mouth at bedtime.     VENLAFAXINE HCL PO Take by mouth.      Musculoskeletal: Strength & Muscle Tone: within normal limits Gait & Station: normal Patient leans: N/A  Psychiatric Specialty Exam:  Presentation  General Appearance: Bizarre  Eye Contact:Minimal  Speech:Pressured  Speech  Volume:Increased  Handedness:Right   Mood and Affect  Mood:Anxious; Euphoric; Irritable  Affect:Inappropriate; Full Range   Thought Process  Thought Processes:Disorganized  Descriptions of Associations:Loose  Orientation:Partial  Thought Content:Illogical; Tangential; Rumination; Scattered; Paranoid Ideation; Delusions  History of Schizophrenia/Schizoaffective disorder:No  Duration of Psychotic Symptoms:Greater than six months  Hallucinations:Hallucinations: None  Ideas of Reference:None  Suicidal Thoughts:Suicidal Thoughts: No  Homicidal Thoughts:Homicidal Thoughts: No   Sensorium  Memory:Immediate Poor; Recent Poor; Remote Poor  Judgment:Impaired  Insight:Lacking   Executive Functions  Concentration:Poor  Attention Span:Poor  Recall:Poor  Fund of Knowledge:Poor  Language:Poor   Psychomotor Activity  Psychomotor Activity:Psychomotor Activity: Normal   Assets  Assets:Communication Skills; Resilience; Social Support   Sleep  Sleep:Sleep: Poor   Physical Exam: Physical Exam Vitals and nursing note reviewed.  Constitutional:      Appearance: Normal appearance.  HENT:     Head: Normocephalic and atraumatic.     Right Ear: External ear normal.     Left Ear: External ear normal.     Nose: Nose normal.     Mouth/Throat:     Mouth: Mucous membranes are moist.  Cardiovascular:     Rate and Rhythm: Normal rate.     Pulses: Normal pulses.  Pulmonary:     Effort: Pulmonary effort is normal.  Musculoskeletal:  General: Normal range of motion.     Cervical back: Normal range of motion and neck supple.  Neurological:     Mental Status: He is alert. He is disoriented.  Psychiatric:        Attention and Perception: Attention and perception normal.        Mood and Affect: Mood and affect normal.        Speech: Speech is rapid and pressured.        Behavior: Behavior is uncooperative and hyperactive.        Thought Content: Thought  content is paranoid and delusional.        Cognition and Memory: Cognition is impaired. Memory is impaired. He exhibits impaired recent memory and impaired remote memory.        Judgment: Judgment is impulsive and inappropriate.    ROS Blood pressure (!) 173/100, pulse 88, temperature 98.4 F (36.9 C), temperature source Oral, resp. rate 18, height 5\' 10"  (1.778 m), weight 79.4 kg, SpO2 99 %. Body mass index is 25.11 kg/m.  Treatment Plan Summary: Plan Patient does meet criteria for geriatric-psychiatric inpatient admission  Disposition: Recommend psychiatric Inpatient admission when medically cleared. Supportive therapy provided about ongoing stressors.  , NP 06/01/2022 11:06 PM

## 2022-06-01 NOTE — ED Notes (Signed)
Pt dressed out into burgundy scrubs. 

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
## 2022-06-02 ENCOUNTER — Inpatient Hospital Stay
Admission: AD | Admit: 2022-06-02 | Discharge: 2022-06-09 | DRG: 885 | Disposition: A | Discharge status: Home / Self Care | Payer: Medicare Other | Source: Intra-hospital | Attending: Psychiatry | Admitting: Psychiatry

## 2022-06-02 DIAGNOSIS — E119 Type 2 diabetes mellitus without complications: Secondary | ICD-10-CM | POA: Diagnosis present

## 2022-06-02 DIAGNOSIS — Z7984 Long term (current) use of oral hypoglycemic drugs: Secondary | ICD-10-CM

## 2022-06-02 DIAGNOSIS — F319 Bipolar disorder, unspecified: Secondary | ICD-10-CM | POA: Diagnosis present

## 2022-06-02 DIAGNOSIS — I10 Essential (primary) hypertension: Secondary | ICD-10-CM | POA: Diagnosis present

## 2022-06-02 DIAGNOSIS — G8929 Other chronic pain: Secondary | ICD-10-CM | POA: Diagnosis present

## 2022-06-02 DIAGNOSIS — M549 Dorsalgia, unspecified: Secondary | ICD-10-CM | POA: Diagnosis present

## 2022-06-02 DIAGNOSIS — Z955 Presence of coronary angioplasty implant and graft: Secondary | ICD-10-CM | POA: Diagnosis not present

## 2022-06-02 DIAGNOSIS — K219 Gastro-esophageal reflux disease without esophagitis: Secondary | ICD-10-CM | POA: Diagnosis present

## 2022-06-02 DIAGNOSIS — Z87891 Personal history of nicotine dependence: Secondary | ICD-10-CM | POA: Diagnosis not present

## 2022-06-02 DIAGNOSIS — Z981 Arthrodesis status: Secondary | ICD-10-CM | POA: Diagnosis not present

## 2022-06-02 DIAGNOSIS — F39 Unspecified mood [affective] disorder: Secondary | ICD-10-CM | POA: Diagnosis not present

## 2022-06-02 DIAGNOSIS — K5190 Ulcerative colitis, unspecified, without complications: Secondary | ICD-10-CM | POA: Diagnosis present

## 2022-06-02 DIAGNOSIS — Z79899 Other long term (current) drug therapy: Secondary | ICD-10-CM

## 2022-06-02 DIAGNOSIS — F22 Delusional disorders: Secondary | ICD-10-CM | POA: Diagnosis present

## 2022-06-02 DIAGNOSIS — M1990 Unspecified osteoarthritis, unspecified site: Secondary | ICD-10-CM | POA: Diagnosis present

## 2022-06-02 DIAGNOSIS — Z794 Long term (current) use of insulin: Secondary | ICD-10-CM | POA: Diagnosis not present

## 2022-06-02 DIAGNOSIS — Z7985 Long-term (current) use of injectable non-insulin antidiabetic drugs: Secondary | ICD-10-CM

## 2022-06-02 DIAGNOSIS — F333 Major depressive disorder, recurrent, severe with psychotic symptoms: Secondary | ICD-10-CM | POA: Diagnosis not present

## 2022-06-02 LAB — GLUCOSE, CAPILLARY
Glucose-Capillary: 150 mg/dL — ABNORMAL HIGH (ref 70–99)
Glucose-Capillary: 191 mg/dL — ABNORMAL HIGH (ref 70–99)

## 2022-06-02 MED ORDER — HALOPERIDOL 5 MG PO TABS: 5.0000 mg | ORAL_TABLET | Freq: Four times a day (QID) | ORAL | Status: DC | PRN

## 2022-06-02 MED ORDER — PREGABALIN 25 MG PO CAPS
75.0000 mg | ORAL_CAPSULE | Freq: Every day | ORAL | Status: DC
  Administered 2022-06-02 – 2022-06-08 (×7): 75 mg via ORAL
  Filled 2022-06-02 (×4): qty 3
  Filled 2022-06-02 (×2): qty 1
  Filled 2022-06-02: qty 3

## 2022-06-02 MED ORDER — OLANZAPINE 5 MG PO TABS
10.0000 mg | ORAL_TABLET | Freq: Every day | ORAL | Status: DC
  Administered 2022-06-02 – 2022-06-05 (×4): 10 mg via ORAL
  Filled 2022-06-02 (×4): qty 2

## 2022-06-02 MED ORDER — INSULIN GLARGINE-YFGN 100 UNIT/ML ~~LOC~~ SOLN
22.0000 [IU] | Freq: Every day | SUBCUTANEOUS | Status: DC
  Administered 2022-06-02 – 2022-06-08 (×7): 22 [IU] via SUBCUTANEOUS
  Filled 2022-06-02 (×8): qty 0.22

## 2022-06-02 MED ORDER — LORAZEPAM 2 MG/ML IJ SOLN: 1.0000 mg | INTRAMUSCULAR | Status: DC | PRN

## 2022-06-02 MED ORDER — LISINOPRIL 10 MG PO TABS
10.0000 mg | ORAL_TABLET | Freq: Once | ORAL | Status: AC
  Administered 2022-06-02: 10 mg via ORAL
  Filled 2022-06-02: qty 1

## 2022-06-02 MED ORDER — TRAZODONE HCL 100 MG PO TABS
100.0000 mg | ORAL_TABLET | Freq: Every evening | ORAL | Status: DC | PRN
  Administered 2022-06-07 – 2022-06-09 (×2): 100 mg via ORAL
  Filled 2022-06-02 (×2): qty 1

## 2022-06-02 MED ORDER — HALOPERIDOL LACTATE 5 MG/ML IJ SOLN: 10.0000 mg | Freq: Four times a day (QID) | INTRAMUSCULAR | Status: DC | PRN

## 2022-06-02 MED ORDER — MAGNESIUM HYDROXIDE 400 MG/5ML PO SUSP: 30.0000 mL | Freq: Every day | ORAL | Status: DC | PRN

## 2022-06-02 MED ORDER — MIRTAZAPINE 15 MG PO TABS
15.0000 mg | ORAL_TABLET | Freq: Every day | ORAL | Status: DC
  Administered 2022-06-02 – 2022-06-08 (×7): 15 mg via ORAL
  Filled 2022-06-02 (×7): qty 1

## 2022-06-02 MED ORDER — CLONAZEPAM 0.5 MG PO TABS
0.5000 mg | ORAL_TABLET | ORAL | Status: DC
  Administered 2022-06-02 – 2022-06-06 (×8): 0.5 mg via ORAL
  Filled 2022-06-02 (×9): qty 1

## 2022-06-02 MED ORDER — ACETAMINOPHEN 325 MG PO TABS
650.0000 mg | ORAL_TABLET | Freq: Four times a day (QID) | ORAL | Status: DC | PRN
  Administered 2022-06-02 – 2022-06-08 (×4): 650 mg via ORAL
  Filled 2022-06-02 (×4): qty 2

## 2022-06-02 MED ORDER — TRAZODONE HCL 100 MG PO TABS: 100.0000 mg | ORAL_TABLET | Freq: Every day | ORAL | Status: DC

## 2022-06-02 MED ORDER — ALUM & MAG HYDROXIDE-SIMETH 200-200-20 MG/5ML PO SUSP
30.0000 mL | ORAL | Status: DC | PRN
  Administered 2022-06-08 (×2): 30 mL via ORAL
  Filled 2022-06-02 (×2): qty 30

## 2022-06-02 MED ORDER — OLANZAPINE 5 MG PO TABS
5.0000 mg | ORAL_TABLET | ORAL | Status: DC
  Administered 2022-06-02 – 2022-06-06 (×8): 5 mg via ORAL
  Filled 2022-06-02 (×8): qty 1

## 2022-06-02 MED ORDER — INSULIN GLARGINE 100 UNIT/ML ~~LOC~~ SOLN
22.0000 [IU] | Freq: Every day | SUBCUTANEOUS | Status: DC
  Filled 2022-06-02: qty 0.22

## 2022-06-02 MED ORDER — ATORVASTATIN CALCIUM 10 MG PO TABS
10.0000 mg | ORAL_TABLET | Freq: Every evening | ORAL | Status: DC
  Administered 2022-06-02 – 2022-06-08 (×7): 10 mg via ORAL
  Filled 2022-06-02 (×7): qty 1

## 2022-06-02 MED ORDER — LORAZEPAM 1 MG PO TABS: 1.0000 mg | ORAL_TABLET | ORAL | Status: DC | PRN

## 2022-06-02 MED ORDER — PANTOPRAZOLE SODIUM 20 MG PO TBEC
20.0000 mg | DELAYED_RELEASE_TABLET | Freq: Every day | ORAL | Status: DC
Start: 2022-06-02 — End: 2022-06-09
  Administered 2022-06-02 – 2022-06-09 (×7): 20 mg via ORAL
  Filled 2022-06-02 (×8): qty 1

## 2022-06-02 MED ORDER — LISINOPRIL 20 MG PO TABS
10.0000 mg | ORAL_TABLET | Freq: Every day | ORAL | Status: DC
  Administered 2022-06-02 – 2022-06-09 (×8): 10 mg via ORAL
  Filled 2022-06-02 (×8): qty 1

## 2022-06-02 MED ORDER — CLONIDINE HCL 0.1 MG PO TABS
0.1000 mg | ORAL_TABLET | Freq: Once | ORAL | Status: AC
Start: 2022-06-02 — End: 2022-06-02
  Administered 2022-06-02: 0.1 mg via ORAL
  Filled 2022-06-02: qty 1

## 2022-06-02 NOTE — Progress Notes (Addendum)
After shift exchange, patient found sitting upright in bed tearful asking, "Why is God tormenting me? I can't turn my mind off!" Patient continued with pressured speech about politics/Joe Biden and Trump. States family does not agree with his political views and he is troubled by it. Active listening and therapeutic communication offered. Pt denies SI/HI/AVH and agrees to contract for safety.  Patient met with his treatment team and was able to articulate. Nursing present.   At approx 1225, new medication of klonopin 0.81m, lisinopril 168m and zyprexa 53m34mdm as per MD order. New med education performed at this time. At approx 1330, patient complained of being light headed saying that the medication received earlier was "too much" and I think I may have fallen. Patient was standing erect at the time and did not appear to be in any distress. Focused assessment performed, neuro check conducted, vital signs obtained while sitting: T: 97.4, P: 90, R 16, BP: 147/79, O2: 99%. No bruising or any other visible trauma noted. Patient denies pain and is able to ambulate without difficulty.   Later in the shift in the shift, patient was ambulating up and down the hallway with walker asking if it was okay to keep doing 'laps'.   No further complaints of being lightheaded.
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
## 2022-06-02 NOTE — Group Note (Signed)
LCSW Group Therapy Note   Group Date: 06/02/2022 Start Time: 1330 End Time: 1420   Type of Therapy and Topic:  Group Therapy: Challenging Core Beliefs  Participation Level:  Active  Description of Group:  Patients were educated about core beliefs and asked to identify one harmful core belief that they have. Patients were asked to explore from where those beliefs originate. Patients were asked to discuss how those beliefs make them feel and the resulting behaviors of those beliefs. They were then be asked if those beliefs are true and, if so, what evidence they have to support them. Lastly, group members were challenged to replace those negative core beliefs with helpful beliefs.   Therapeutic Goals:   1. Patient will identify harmful core beliefs and explore the origins of such beliefs. 2. Patient will identify feelings and behaviors that result from those core beliefs. 3. Patient will discuss whether such beliefs are true. 4.  Patient will replace harmful core beliefs with helpful ones.  Summary of Patient Progress:  Patient actively engaged in processing and exploring how core beliefs are formed and how they impact thoughts, feelings, and behaviors. Patient proved open to input from peers and feedback from CSW. Patient demonstrated poor insight into the subject matter, was respectful and supportive of peers, and participated throughout the entire session.  Patient required some redirection.  Therapeutic Modalities: Cognitive Behavioral Therapy; Solution-Focused Therapy   Jeffery Beck, Theresia Majors 06/02/2022  4:16 PM

## 2022-06-02 NOTE — [facility] Suicide Risk Assessment (Signed)
[facility] INPATIENT:  Family/Significant Other Suicide Prevention Education  Suicide Prevention Education:  Contact Attempts: Merian Capron Jaguas, daughter, [phone]  has been identified by the patient as the family member/significant other with whom the patient will be residing, and identified as the person(s) who will aid the patient in the event of a mental health crisis.  With written consent from the patient, two attempts were made to provide suicide prevention education, prior to and/or following the patient's discharge.  We were unsuccessful in providing suicide prevention education.  A suicide education pamphlet was given to the patient to share with family/significant other.  Date and time of first attempt:06/02/2022 at 12:50 PM  Date and time of second attempt: Second attempt/better contact information is needed.  CSSW attempted to contact the patient's daughter via the information provided.  The number was identified as not being in service when called.   Harden Mo 06/02/2022, 12:49 PM

## 2022-06-02 NOTE — BH Assessment (Signed)
0300  Patient has orders to be admitted to the [facility] unit.Received patient in the exam room. He is alert and oriented x1, hyper-verbal, he is using a cane. His cane was traded out for a walker, and is considered a high fall risk. He is stable using his cane and the walker. His current BP is 176/99, pulse 79 R 20  O2 sat is 99/% T 98 wt. 173.  The ER Dept reported that  they treated the elevated BP with 1 mg of Clonidine and that BP was 136/86 at 0240 His affected is stressed and he is labile going from extreme sadness to crying to being frustrated.  Patient is a type II insulin dependent diabetic.  His conversation is him feeling ganged up on because of his political and religious stance and the he is being rejected because of his stance.  Medical: He has a history of having a triple bypass, spine surgery, left shoulder . He is able to give a  good account of his medical history. He reports that he is scheduled for a colon scopy.   He has a mid-sternum scar that is approx. 8 inches long and well healed, left shoulder scar that is approx 3 inches in length, left knee scar approx. 5 inches in length,  and mid- spine scar that is approx 7-8 inches in length, and a healing 3 cm skin abrasion on his right hand, a 1.5 inch deep scratch with scab on the top of his right foot.   9702 Patient received Tylenol 650 mg for hip pain; pain level 7. Will continue to monitor for pain and safety.

## 2022-06-02 NOTE — ED Provider Notes (Addendum)
-----------------------------------------   1:21 AM on 06/02/2022 -----------------------------------------   I am told by the [facility] psych unit that patient's blood pressure of 163/89 is too high for them to accept the patient and they would like his blood pressure treated.  Patient currently asymptomatic.  There is no pharmacy tech tonight to be be able to reconcile patient's medications.  I see that he takes for is supposed to take lisinopril but there is no dosage listed.  Will administer 0.1mg  Clonidine for blood pressure control to satisfy the [facility] psych unit's acceptance criteria.   ----------------------------------------- 2:52 AM on 06/02/2022 -----------------------------------------   Blood pressure 165/95.  Geropsych unit not satisfied.  Will administer standard dose Lisinopril 10mg .  ----------------------------------------- 3:54 AM on 06/02/2022 -----------------------------------------  Blood pressure 136/86.  Accepted to geropsych unit.   08/02/2022, MD 06/02/22 [phone]

## 2022-06-02 NOTE — BHH Counselor (Signed)
Patient declining aftercare at this time.   Penni Homans, MSW, LCSW 06/02/2022 12:51 PM
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
## 2022-06-02 NOTE — BH IP Treatment Plan (Signed)
Interdisciplinary Treatment and Diagnostic Plan Update  06/02/2022 Time of Session: 9:30AM Jeffery Beck MRN: [id number]  Principal Diagnosis: Mood disorder (HCC)  Secondary Diagnoses: Principal Problem:   Mood disorder (HCC)   Current Medications:  Current Facility-Administered Medications  Medication Dose Route Frequency Provider Last Rate Last Admin   acetaminophen (TYLENOL) tablet 650 mg  650 mg Oral Q6H PRN Thompson, Jacqueline, NP   650 mg at 06/02/22 0534   alum & mag hydroxide-simeth (MAALOX/MYLANTA) 200-200-20 MG/5ML suspension 30 mL  30 mL Oral Q4H PRN Thompson, Jacqueline, NP       atorvastatin (LIPITOR) tablet 10 mg  10 mg Oral QPM Thompson, Jacqueline, NP       clonazePAM (KLONOPIN) tablet 0.5 mg  0.5 mg Oral BH-q8a4p Herrick, Richard Edward, DO   0.5 mg at 06/02/22 1226   haloperidol (HALDOL) tablet 5 mg  5 mg Oral Q6H PRN Herrick, Richard Edward, DO       Or   haloperidol lactate (HALDOL) injection 10 mg  10 mg Intramuscular Q6H PRN Herrick, Richard Edward, DO       insulin glargine-yfgn (SEMGLEE) injection 22 Units  22 Units Subcutaneous QHS Herrick, Richard Edward, DO       lisinopril (ZESTRIL) tablet 10 mg  10 mg Oral QPC breakfast Herrick, Richard Edward, DO   10 mg at 06/02/22 1227   LORazepam (ATIVAN) tablet 1 mg  1 mg Oral Q4H PRN Herrick, Richard Edward, DO       Or   LORazepam (ATIVAN) injection 1 mg  1 mg Intramuscular Q4H PRN Herrick, Richard Edward, DO       magnesium hydroxide (MILK OF MAGNESIA) suspension 30 mL  30 mL Oral Daily PRN Thompson, Jacqueline, NP       mirtazapine (REMERON) tablet 15 mg  15 mg Oral QHS Herrick, Richard Edward, DO       OLANZapine (ZYPREXA) tablet 10 mg  10 mg Oral QHS Herrick, Richard Edward, DO       OLANZapine (ZYPREXA) tablet 5 mg  5 mg Oral BH-q8a4p Herrick, Richard Edward, DO   5 mg at 06/02/22 1231   pantoprazole (PROTONIX) EC tablet 20 mg  20 mg Oral Daily Thompson, Jacqueline, NP   20 mg at 06/02/22 1034   pregabalin  (LYRICA) capsule 75 mg  75 mg Oral QHS Thompson, Jacqueline, NP       traZODone (DESYREL) tablet 100 mg  100 mg Oral QHS PRN Herrick, Richard Edward, DO       PTA Medications: Medications Prior to Admission  Medication Sig Dispense Refill Last Dose   atorvastatin (LIPITOR) 10 MG tablet Take by mouth.      dapagliflozin propanediol (FARXIGA) 5 MG TABS tablet Take by mouth.      Insulin Glargine (LANTUS [place]) Inject 22 Units into the skin at bedtime.      LISINOPRIL PO Take by mouth.      MIRTAZAPINE PO Take 1 tablet by mouth at bedtime.      nortriptyline (PAMELOR) 10 MG capsule Take 10 mg by mouth at bedtime.      pantoprazole (PROTONIX) 20 MG tablet Take 1 tablet (20 mg total) by mouth daily for 14 days. 14 tablet 0    pregabalin (LYRICA) 75 MG capsule Take 75 mg by mouth at bedtime.      Semaglutide (OZEMPIC, 1 MG/DOSE, [place]) Inject 1 mg into the skin once a week.      traMADol (ULTRAM) 50 MG tablet Take 50 mg by mouth as   needed.      traZODone (DESYREL) 100 MG tablet Take 100 mg by mouth at bedtime.      VENLAFAXINE HCL PO Take by mouth.       Patient Stressors:    Patient Strengths:    Treatment Modalities: Medication Management, Group therapy, Case management,  1 to 1 session with clinician, Psychoeducation, Recreational therapy.   Physician Treatment Plan for Primary Diagnosis: Mood disorder (HCC) Long Term Goal(s): Improvement in symptoms so as ready for discharge   Short Term Goals: Ability to identify changes in lifestyle to reduce recurrence of condition will improve Ability to verbalize feelings will improve Ability to disclose and discuss suicidal ideas Ability to demonstrate self-control will improve Ability to identify and develop effective coping behaviors will improve Ability to maintain clinical measurements within normal limits will improve Compliance with prescribed medications will improve Ability to identify triggers associated with substance abuse/mental health  issues will improve  Medication Management: Evaluate patient's response, side effects, and tolerance of medication regimen.  Therapeutic Interventions: 1 to 1 sessions, Unit Group sessions and Medication administration.  Evaluation of Outcomes: Not Met  Physician Treatment Plan for Secondary Diagnosis: Principal Problem:   Mood disorder (HCC)  Long Term Goal(s): Improvement in symptoms so as ready for discharge   Short Term Goals: Ability to identify changes in lifestyle to reduce recurrence of condition will improve Ability to verbalize feelings will improve Ability to disclose and discuss suicidal ideas Ability to demonstrate self-control will improve Ability to identify and develop effective coping behaviors will improve Ability to maintain clinical measurements within normal limits will improve Compliance with prescribed medications will improve Ability to identify triggers associated with substance abuse/mental health issues will improve     Medication Management: Evaluate patient's response, side effects, and tolerance of medication regimen.  Therapeutic Interventions: 1 to 1 sessions, Unit Group sessions and Medication administration.  Evaluation of Outcomes: Not Met   RN Treatment Plan for Primary Diagnosis: Mood disorder (HCC) Long Term Goal(s): Knowledge of disease and therapeutic regimen to maintain health will improve  Short Term Goals: Ability to demonstrate self-control, Ability to participate in decision making will improve, Ability to verbalize feelings will improve, Ability to disclose and discuss suicidal ideas, Ability to identify and develop effective coping behaviors will improve, and Compliance with prescribed medications will improve  Medication Management: RN will administer medications as ordered by provider, will assess and evaluate patient's response and provide education to patient for prescribed medication. RN will report any adverse and/or side effects  to prescribing provider.  Therapeutic Interventions: 1 on 1 counseling sessions, Psychoeducation, Medication administration, Evaluate responses to treatment, Monitor vital signs and CBGs as ordered, Perform/monitor CIWA, COWS, AIMS and Fall Risk screenings as ordered, Perform wound care treatments as ordered.  Evaluation of Outcomes: Not Met   LCSW Treatment Plan for Primary Diagnosis: Mood disorder (HCC) Long Term Goal(s): Safe transition to appropriate next level of care at discharge, Engage patient in therapeutic group addressing interpersonal concerns.  Short Term Goals: Engage patient in aftercare planning with referrals and resources, Increase social support, Increase ability to appropriately verbalize feelings, Increase emotional regulation, Facilitate acceptance of mental health diagnosis and concerns, and Increase skills for wellness and recovery  Therapeutic Interventions: Assess for all discharge needs, 1 to 1 time with Social worker, Explore available resources and support systems, Assess for adequacy in community support network, Educate family and significant other(s) on suicide prevention, Complete Psychosocial Assessment, Interpersonal group therapy.  Evaluation of Outcomes: Not   Met   Progress in Treatment: Attending groups: No. Participating in groups: No. Taking medication as prescribed: Yes. Toleration medication: Yes. Family/Significant other contact made: No, will contact:  once permission is given. Patient understands diagnosis: No. Discussing patient identified problems/goals with staff: Yes. Medical problems stabilized or resolved: Yes. Denies suicidal/homicidal ideation: Yes. Issues/concerns per patient self-inventory: No. Other: none  New problem(s) identified: No, Describe:  none  New Short Term/Long Term Goal(s): elimination of symptoms of psychosis, medication management for mood stabilization; elimination of SI thoughts; development of comprehensive  mental wellness plan.   Patient Goals:  "I need someone to go in and gently clean the home, it has roaches"  Discharge Plan or Barriers: CSW to assist patient in development of appropriate discharge plans.   Reason for Continuation of Hospitalization: Anxiety Depression Medical Issues Medication stabilization  Estimated Length of Stay:  1-7 days  Last 3 [place] Suicide Severity Risk Score: Flowsheet Row Admission (Current) from 06/02/2022 in [place] ED from 06/01/2022 in [place] ED from 05/29/2022 in [place] No Risk No Risk No Risk       Last PHQ 2/9 Scores:     No data to display          Scribe for Treatment Team: Rozann Lesches, Marlinda Mike 06/02/2022 12:40 PM

## 2022-06-02 NOTE — BHH Suicide Risk Assessment (Signed)
[facility] Admission Suicide Risk Assessment   Nursing information obtained from:  Patient Demographic factors:  Age 71 or older, Caucasian Current Mental Status:  NA Loss Factors:  Decline in physical health, Financial problems / change in socioeconomic status Historical Factors:  NA Risk Reduction Factors:  Sense of responsibility to family, Employed, Living with another person, especially a relative  Total Time spent with patient: 1 hour Principal Problem: Mood disorder (HCC) Diagnosis:  Principal Problem:   Mood disorder (HCC)  Subjective Data: Jeffery Beck is a 71 year old white male who initially presented to the emergency room with abdominal pain and a history of colitis.  He was discharged and returned 4 days later with mental status changes on involuntary commitment.  He was paranoid and delusional.  He is extremely poor historian.  It was difficult to get a history from him but apparently he lives with his 72 year old father and his brother in [place].  He is followed by the [facility] outpatient.  He rambles about politics and arguing with his family.  He is currently on disability.  He gets very agitated when you ask him simple questions like who is her doctor.  He has been married twice and divorced twice.  He was admitted to [facility] back in 2014.  He denies all signs and symptoms although he presents very confused, paranoid, agitated, and possibly psychotic.  Continued Clinical Symptoms:    The "Alcohol Use Disorders Identification Test", Guidelines for Use in Primary Care, Second Edition.  World Science writer [facility]). Score between 0-7:  no or low risk or alcohol related problems. Score between 8-15:  moderate risk of alcohol related problems. Score between 16-19:  high risk of alcohol related problems. Score 20 or above:  warrants further diagnostic evaluation for alcohol dependence and treatment.   CLINICAL FACTORS:   Bipolar Disorder:   Mixed State   Musculoskeletal: Strength & Muscle  Tone: within normal limits Gait & Station: normal Patient leans: N/A  Psychiatric Specialty Exam:  Presentation  General Appearance: Bizarre  Eye Contact:Minimal  Speech:Pressured  Speech Volume:Increased  Handedness:Right   Mood and Affect  Mood:Anxious; Euphoric; Irritable  Affect:Inappropriate; Full Range   Thought Process  Thought Processes:Disorganized  Descriptions of Associations:Loose  Orientation:Partial  Thought Content:Illogical; Tangential; Rumination; Scattered; Paranoid Ideation; Delusions  History of Schizophrenia/Schizoaffective disorder:No  Duration of Psychotic Symptoms:Greater than six months  Hallucinations:Hallucinations: None  Ideas of Reference:None  Suicidal Thoughts:Suicidal Thoughts: No  Homicidal Thoughts:Homicidal Thoughts: No   Sensorium  Memory:Immediate Poor; Recent Poor; Remote Poor  Judgment:Impaired  Insight:Lacking   Executive Functions  Concentration:Poor  Attention Span:Poor  Recall:Poor  Fund of Knowledge:Poor  Language:Poor   Psychomotor Activity  Psychomotor Activity:Psychomotor Activity: Normal   Assets  Assets:Communication Skills; Resilience; Social Support   Sleep  Sleep:Sleep: Poor     Blood pressure (!) 157/104, pulse 89, temperature 98 F (36.7 C), temperature source Oral, resp. rate 20, height 6' (1.829 m), SpO2 99 %. Body mass index is 23.73 kg/m.   COGNITIVE FEATURES THAT CONTRIBUTE TO RISK:  Loss of executive function    SUICIDE RISK:   Minimal: No identifiable suicidal ideation.  Patients presenting with no risk factors but with morbid ruminations; may be classified as minimal risk based on the severity of the depressive symptoms  PLAN OF CARE: See orders  I certify that inpatient services furnished can reasonably be expected to improve the patient's condition.   Sarina Ill, DO 06/02/2022, 10:57 AM

## 2022-06-02 NOTE — BH Assessment (Signed)
PATIENT BED AVAILABLE PENDING MEDICAL CLEARANCE OF BLOOD PRESSURE, BLOOD PRESSURE IS CURRENTLY TOO HIGH  Patient is to be admitted to [facility] by Psychiatric Nurse Practitioner Gillermo Murdoch.  Attending Physician will be Dr.  Marlou Porch .   Patient has been assigned to room L30, by [facility] Charge Nurse Venus.    ER staff is aware of the admission: Dr. Dolores Frame, ER MD  Florentina Addison Patient's Nurse  Rosey Bath Patient Access.

## 2022-06-03 LAB — GLUCOSE, CAPILLARY
Glucose-Capillary: 131 mg/dL — ABNORMAL HIGH (ref 70–99)
Glucose-Capillary: 150 mg/dL — ABNORMAL HIGH (ref 70–99)
Glucose-Capillary: 182 mg/dL — ABNORMAL HIGH (ref 70–99)
Glucose-Capillary: 135 mg/dL — ABNORMAL HIGH (ref 70–99)

## 2022-06-03 NOTE — Progress Notes (Signed)
[facility] MD Progress Note  06/03/2022 12:56 PM Jeffery Beck  MRN:  [id number] Subjective:  Nursing Report : Patient presents with depressed mood, affect congruent. Jeffery Beck states he feels he is better, and slept well last night and '' would like to leave '' . Patient states his high school reunion is scheduled for this weekend tonight and he is looking forward to going to that and he does not want to miss it because it has been rescheduled since '' covid and I really want to be out of here. '' Patient states he is not having any SI or HI or AV Hallucinations. However, patient after much discussion on mood eluded to paranoid belief regarding his hearing aid. He states '' yeah, the [place], I'm a veteran, and they are supposed to help me and so I went there and got new hearing aids, and every since they connected them to the internet, the blue tooth then the government found out about me being a democrat, and bad things started happening. '' Patient appears to have paranoid beliefs regarding his hearing aids and his political views having made him a target.  Patient requested to shave, and assist with shower and linen. Patient is more isolative to room with minimal interaction with peers. He did ask for reading material which was provided. Ambulatory on the unit, eating meals and compliant with am medications with the exception of his protonix as he states '' I don't need that I don't have reflux or an ulcer. ''  Patient has generally been calm, compliant with his medicines and appropriate on the floor today. He presents somewhat anxious, depressed and paranoid. He is worried about his hearing aids causing his bank accounts to close and spying on him. He did sleep well last night, ate lunch and is now laying down in bed.   Principal Problem: Mood disorder (HCC) Diagnosis: Principal Problem:   Mood disorder (HCC)  Total Time spent with patient: 20 minutes  Past Psychiatric History: Bipolar D/O  Past Medical  History:  Past Medical History:  Diagnosis Date   Chronic back pain    Hypertension    Osteoarthritis     Past Surgical History:  Procedure Laterality Date   2 heart stents     back fusion     BACK SURGERY     trial back stimulator for chronic back pain   Family History: No family history on file. Family Psychiatric  History: none Social History:  Social History   Substance and Sexual Activity  Alcohol Use Not Currently     Social History   Substance and Sexual Activity  Drug Use Yes   Comment: prescribed tramadol    Social History   Socioeconomic History   Marital status: Divorced    Spouse name: Not on file   Number of children: Not on file   Years of education: Not on file   Highest education level: Not on file  Occupational History   Not on file  Tobacco Use   Smoking status: Former    Types: Cigarettes   Smokeless tobacco: Never  Vaping Use   Vaping Use: Never used  Substance and Sexual Activity   Alcohol use: Not Currently   Drug use: Yes    Comment: prescribed tramadol   Sexual activity: Not on file  Other Topics Concern   Not on file  Social History Narrative   Not on file   Social Determinants of Health   Financial Resource Strain: Not on file  Food Insecurity: Not on file  Transportation Needs: Not on file  Physical Activity: Not on file  Stress: Not on file  Social Connections: Not on file   Additional Social History:                         Sleep: Good  Appetite:  Fair  Current Medications: Current Facility-Administered Medications  Medication Dose Route Frequency Provider Last Rate Last Admin   acetaminophen (TYLENOL) tablet 650 mg  650 mg Oral Q6H PRN Caroline Sauger, NP   650 mg at 06/02/22 0534   alum & mag hydroxide-simeth (MAALOX/MYLANTA) 200-200-20 MG/5ML suspension 30 mL  30 mL Oral Q4H PRN Caroline Sauger, NP       atorvastatin (LIPITOR) tablet 10 mg  10 mg Oral QPM Caroline Sauger, NP   10 mg at  06/02/22 1848   clonazePAM (KLONOPIN) tablet 0.5 mg  0.5 mg Oral BH-q8a4p Parks Ranger, DO   0.5 mg at 06/03/22 [id number]   haloperidol (HALDOL) tablet 5 mg  5 mg Oral Q6H PRN Parks Ranger, DO       Or   haloperidol lactate (HALDOL) injection 10 mg  10 mg Intramuscular Q6H PRN Parks Ranger, DO       insulin glargine-yfgn [facility]) injection 22 Units  22 Units Subcutaneous QHS Parks Ranger, DO   22 Units at 06/02/22 2146   lisinopril (ZESTRIL) tablet 10 mg  10 mg Oral QPC breakfast Parks Ranger, DO   10 mg at 06/03/22 [id number]   LORazepam (ATIVAN) tablet 1 mg  1 mg Oral Q4H PRN Parks Ranger, DO       Or   LORazepam (ATIVAN) injection 1 mg  1 mg Intramuscular Q4H PRN Parks Ranger, DO       magnesium hydroxide (MILK OF MAGNESIA) suspension 30 mL  30 mL Oral Daily PRN Caroline Sauger, NP       mirtazapine (REMERON) tablet 15 mg  15 mg Oral QHS Parks Ranger, DO   15 mg at 06/02/22 2146   OLANZapine (ZYPREXA) tablet 10 mg  10 mg Oral QHS Parks Ranger, DO   10 mg at 06/02/22 2153   OLANZapine (ZYPREXA) tablet 5 mg  5 mg Oral BH-q8a4p Parks Ranger, DO   5 mg at 06/03/22 0827   pantoprazole (PROTONIX) EC tablet 20 mg  20 mg Oral Daily Caroline Sauger, NP   20 mg at 06/02/22 1034   pregabalin (LYRICA) capsule 75 mg  75 mg Oral QHS Caroline Sauger, NP   75 mg at 06/02/22 2251   traZODone (DESYREL) tablet 100 mg  100 mg Oral QHS PRN Parks Ranger, DO        Lab Results:  Results for orders placed or performed during the hospital encounter of 06/02/22 (from the past 48 hour(s))  Glucose, capillary     Status: Abnormal   Collection Time: 06/02/22 12:29 PM  Result Value Ref Range   Glucose-Capillary 150 (H) 70 - 99 mg/dL    Comment: Glucose reference range applies only to samples taken after fasting for at least 8 hours.  Glucose, capillary     Status: Abnormal   Collection Time:  06/02/22  7:48 PM  Result Value Ref Range   Glucose-Capillary 191 (H) 70 - 99 mg/dL    Comment: Glucose reference range applies only to samples taken after fasting for at least 8 hours.  Glucose, capillary  Status: Abnormal   Collection Time: 06/03/22  7:48 AM  Result Value Ref Range   Glucose-Capillary 131 (H) 70 - 99 mg/dL    Comment: Glucose reference range applies only to samples taken after fasting for at least 8 hours.  Glucose, capillary     Status: Abnormal   Collection Time: 06/03/22 11:38 AM  Result Value Ref Range   Glucose-Capillary 150 (H) 70 - 99 mg/dL    Comment: Glucose reference range applies only to samples taken after fasting for at least 8 hours.    Blood Alcohol level:  Lab Results  Component Value Date   ETH <10 [id number]    Metabolic Disorder Labs: Lab Results  Component Value Date   HGBA1C 8.5 (H) 11/17/2012   No results found for: "PROLACTIN" No results found for: "CHOL", "TRIG", "HDL", "CHOLHDL", "VLDL", "LDLCALC"  Physical Findings: AIMS:  , ,  ,  ,    CIWA:    COWS:     Musculoskeletal: Strength & Muscle Tone: decreased Gait & Station: unsteady Patient leans: N/A  Psychiatric Specialty Exam:  Presentation  General Appearance: Bizarre  Eye Contact:Minimal  Speech:Pressured  Speech Volume:Increased  Handedness:Right   Mood and Affect  Mood:Anxious; Euphoric; Irritable  Affect:Inappropriate; Full Range   Thought Process  Thought Processes:Disorganized  Descriptions of Associations:Loose  Orientation:Partial  Thought Content:Illogical; Tangential; Rumination; Scattered; Paranoid Ideation; Delusions  History of Schizophrenia/Schizoaffective disorder:No  Duration of Psychotic Symptoms:Greater than six months  Hallucinations:No data recorded Ideas of Reference:None  Suicidal Thoughts:No data recorded Homicidal Thoughts:No data recorded  Sensorium  Memory:Immediate Poor; Recent Poor; Remote  Poor  Judgment:Impaired  Insight:Lacking   Executive Functions  Concentration:Poor  Attention Span:Poor  Recall:Poor  Fund of Knowledge:Poor  Language:Poor   Psychomotor Activity  Psychomotor Activity:No data recorded  Assets  Assets:Communication Skills; Resilience; Social Support   Sleep  Sleep:No data recorded   Physical Exam: Physical Exam Constitutional:      Appearance: Normal appearance. He is normal weight.  HENT:     Head: Normocephalic and atraumatic.     Right Ear: External ear normal.     Left Ear: External ear normal.     Nose: Nose normal.     Mouth/Throat:     Mouth: Mucous membranes are moist.     Pharynx: Oropharynx is clear.  Eyes:     Extraocular Movements: Extraocular movements intact.     Conjunctiva/sclera: Conjunctivae normal.     Pupils: Pupils are equal, round, and reactive to light.  Cardiovascular:     Rate and Rhythm: Normal rate and regular rhythm.     Pulses: Normal pulses.     Heart sounds: Normal heart sounds.  Pulmonary:     Effort: Pulmonary effort is normal.     Breath sounds: Normal breath sounds.  Abdominal:     General: Abdomen is flat. Bowel sounds are normal.     Palpations: Abdomen is soft.  Musculoskeletal:        General: Normal range of motion.     Cervical back: Normal range of motion and neck supple.  Skin:    General: Skin is warm and dry.  Neurological:     Mental Status: He is alert. Mental status is at baseline.    Review of Systems  Constitutional: Negative.   HENT: Negative.    Eyes: Negative.   Respiratory: Negative.    Cardiovascular: Negative.   Gastrointestinal: Negative.   Genitourinary: Negative.   Musculoskeletal: Negative.   Skin: Negative.   Neurological: Negative.  Endo/Heme/Allergies: Negative.   Psychiatric/Behavioral:  Positive for depression and memory loss. The patient is nervous/anxious.    Blood pressure 116/80, pulse 79, temperature 97.8 F (36.6 C), resp. rate 18,  height 6' (1.829 m), SpO2 99 %. Body mass index is 23.73 kg/m.   Treatment Plan Summary: Daily contact with patient to assess and evaluate symptoms and progress in treatment, Medication management, and Plan : Continue current medicines and treatment.   Leo Rod 06/03/2022, 12:56 PM

## 2022-06-03 NOTE — Progress Notes (Signed)
Patient is alert and oriented times 3. Mood and affect are paranoid. Patient rates pain as 0/10. She denies SI, HI, and AVH, though he does state that he feels he is under surveillance by "Biden and his people."  Patient stated that ever since he got his new hearing aids, he has been under surveillance and all of his bank accounts have been closed.  Patient also asked about having a colonoscopy scheduled, as well as having he provider order some eye drops for his eyes.  Patient also states that he feels he is being overmedicated because he is unable to focus and feels as if his "brain is foggy." Patient does endorse feelings of anxiety and depression at this time. Patient states he slept well last night. Evening medicines administered whole by mouth without difficulty. Patient ate snack in day room; appetite was fair. Patient remains on unit with Q15 minute checks in place.

## 2022-06-03 NOTE — Progress Notes (Signed)
Patient presents with depressed mood, affect congruent. Promise states he feels he is better, and slept well last night and '' would like to leave '' . Patient states his high school reunion is scheduled for this weekend tonight and he is looking forward to going to that and he does not want to miss it because it has been rescheduled since '' covid and I really want to be out of here. '' Patient states he is not having any SI or HI or AV Hallucinations. However, patient after much discussion on mood eluded to paranoid belief regarding his hearing aid. He states '' yeah, the [place], I'm a veteran, and they are supposed to help me and so I went there and got new hearing aids, and every since they connected them to the internet, the blue tooth then the government found out about me being a democrat, and bad things started happening. '' Patient appears to have paranoid beliefs regarding his hearing aids and his political views having made him a target.  Patient requested to shave, and assist with shower and linen. Writer assisted in shave, gave new scrubs as well as new towels. Patient states '' the doctor only spend 15 seconds with me yesterday. But I'm better. The klonopin helps me. ''  Patient is more isolative to room with minimal interaction with peers. He did ask for reading material which was provided. Ambulatory on the unit, eating meals and compliant with am medications with the exception of his protonix as he states '' I don't need that I don't have reflux or an ulcer. ''  Pt is safe, will con't to monitor.

## 2022-06-03 NOTE — Progress Notes (Signed)
Patient is alert and oriented times 4. Mood and affect are depressed. Patient rates pain as 4/10. He denies SI, HI, and AVH. Patient does endorse feelings of anxiety and depression and complains about the staff not complying with his requests (for example: not providing him with "stimulating reading material") at this time. States he slept well last night. Evening medicines administered whole by mouth without difficulty. Patient ate snack in day room; appetite was fair. Patient remains on unit with Q15 minute checks in place.

## 2022-06-04 LAB — GLUCOSE, CAPILLARY
Glucose-Capillary: 116 mg/dL — ABNORMAL HIGH (ref 70–99)
Glucose-Capillary: 147 mg/dL — ABNORMAL HIGH (ref 70–99)

## 2022-06-04 MED ORDER — NAPHAZOLINE-PHENIRAMINE 0.025-0.3 % OP SOLN
1.0000 [drp] | Freq: Four times a day (QID) | OPHTHALMIC | Status: DC | PRN
  Administered 2022-06-04 – 2022-06-06 (×4): 1 [drp] via OPHTHALMIC
  Filled 2022-06-04: qty 5

## 2022-06-04 NOTE — Progress Notes (Signed)
Patient presents with depressed mood, affect blunted. Jeffery Beck is more tearful today on approach, and with worsening somatic complaints. This am he lists a string of complaints including dry eyes (which saline has been provided for), phlegm in his throat for which he requests allergy medications, and then he goes on to state that he believes he needs to be rechecked for covid as '' I had a coughing spell lastnight, and you know I worry about covid. '' Patient then states '' I just don't feel good when I wake up, I feel drained, groggy, brain fog. It takes me a while but if I get up and keep moving it will get better. I don't want to take anything too sedating. Discussed am medications with patient at length. Patient questioning again whether he should take protonix, however already report last night concerns that he may need a colonoscopy. Patient denies any SI but when writer asked he becomes tearful. Pt denies any HI or A/V Hallucinations but continues to have delusions and paranoia related to hearing aids and political views being known that have impacted his mental health.  Pt is safe, eating meals and able to make his needs known.  Will con't to monitor.

## 2022-06-04 NOTE — [facility] Group Notes (Signed)
Patients taken outside for recreation and [place]. Patient participated, and sang along. Pt was supportive of peers and discussing how music helps him let it out.

## 2022-06-04 NOTE — Progress Notes (Signed)
[facility] MD Progress Note  06/04/2022 10:49 AM Jeffery Beck  MRN:  [id number] Subjective:  Patient presents with depressed mood, affect blunted. Monique is more tearful today on approach, and with worsening somatic complaints. This am he lists a string of complaints including dry eyes (which saline has been provided for), phlegm in his throat for which he requests allergy medications, and then he goes on to state that he believes he needs to be rechecked for covid as '' I had a coughing spell lastnight, and you know I worry about covid. '' Patient then states '' I just don't feel good when I wake up, I feel drained, groggy, brain fog. It takes me a while but if I get up and keep moving it will get better. I don't want to take anything too sedating. Discussed am medications with patient at length. Patient questioning again whether he should take protonix, however already report last night concerns that he may need a colonoscopy. Patient denies any SI but when writer asked he becomes tearful. Pt denies any HI or A/V Hallucinations but continues to have delusions and paranoia related to hearing aids and political views being known that have impacted his mental health.  Pt is safe, eating meals and able to make his needs known.   Patient reports feeling well at this time, has some somatic complaints but overall he reports feeling better. He slept well last night, has been eating well and is compliant with his meds. He wants to be able to go to church or go home soon.  Principal Problem: Mood disorder (HCC) Diagnosis: Principal Problem:   Mood disorder (HCC)  Total Time spent with patient: 20 minutes  Past Psychiatric History: Bipolar   Past Medical History:  Past Medical History:  Diagnosis Date   Chronic back pain    Hypertension    Osteoarthritis     Past Surgical History:  Procedure Laterality Date   2 heart stents     back fusion     BACK SURGERY     trial back stimulator for chronic back pain    Family History: No family history on file. Family Psychiatric  History:  Social History:  Social History   Substance and Sexual Activity  Alcohol Use Not Currently     Social History   Substance and Sexual Activity  Drug Use Yes   Comment: prescribed tramadol    Social History   Socioeconomic History   Marital status: Divorced    Spouse name: Not on file   Number of children: Not on file   Years of education: Not on file   Highest education level: Not on file  Occupational History   Not on file  Tobacco Use   Smoking status: Former    Types: Cigarettes   Smokeless tobacco: Never  Vaping Use   Vaping Use: Never used  Substance and Sexual Activity   Alcohol use: Not Currently   Drug use: Yes    Comment: prescribed tramadol   Sexual activity: Not on file  Other Topics Concern   Not on file  Social History Narrative   Not on file   Social Determinants of Health   Financial Resource Strain: Not on file  Food Insecurity: Not on file  Transportation Needs: Not on file  Physical Activity: Not on file  Stress: Not on file  Social Connections: Not on file   Additional Social History:  Sleep: Fair  Appetite:  Fair  Current Medications: Current Facility-Administered Medications  Medication Dose Route Frequency Provider Last Rate Last Admin   acetaminophen (TYLENOL) tablet 650 mg  650 mg Oral Q6H PRN Gillermo Murdoch, NP   650 mg at 06/02/22 0534   alum & mag hydroxide-simeth (MAALOX/MYLANTA) 200-200-20 MG/5ML suspension 30 mL  30 mL Oral Q4H PRN Gillermo Murdoch, NP       atorvastatin (LIPITOR) tablet 10 mg  10 mg Oral QPM Gillermo Murdoch, NP   10 mg at 06/03/22 1627   clonazePAM (KLONOPIN) tablet 0.5 mg  0.5 mg Oral BH-q8a4p Sarina Ill, DO   0.5 mg at 06/04/22 2778   haloperidol (HALDOL) tablet 5 mg  5 mg Oral Q6H PRN Sarina Ill, DO       Or   haloperidol lactate (HALDOL) injection 10 mg   10 mg Intramuscular Q6H PRN Sarina Ill, DO       insulin glargine-yfgn [facility]) injection 22 Units  22 Units Subcutaneous QHS Sarina Ill, DO   22 Units at 06/03/22 2200   lisinopril (ZESTRIL) tablet 10 mg  10 mg Oral QPC breakfast Sarina Ill, DO   10 mg at 06/04/22 2423   LORazepam (ATIVAN) tablet 1 mg  1 mg Oral Q4H PRN Sarina Ill, DO       Or   LORazepam (ATIVAN) injection 1 mg  1 mg Intramuscular Q4H PRN Sarina Ill, DO       magnesium hydroxide (MILK OF MAGNESIA) suspension 30 mL  30 mL Oral Daily PRN Gillermo Murdoch, NP       mirtazapine (REMERON) tablet 15 mg  15 mg Oral QHS Sarina Ill, DO   15 mg at 06/03/22 2200   naphazoline-pheniramine (NAPHCON-A) 0.025-0.3 % ophthalmic solution 1 drop  1 drop Both Eyes QID PRN Danelle Earthly, Vylet Maffia       OLANZapine (ZYPREXA) tablet 10 mg  10 mg Oral QHS Sarina Ill, DO   10 mg at 06/03/22 2159   OLANZapine (ZYPREXA) tablet 5 mg  5 mg Oral BH-q8a4p Sarina Ill, DO   5 mg at 06/04/22 5361   pantoprazole (PROTONIX) EC tablet 20 mg  20 mg Oral Daily Gillermo Murdoch, NP   20 mg at 06/04/22 4431   pregabalin (LYRICA) capsule 75 mg  75 mg Oral QHS Gillermo Murdoch, NP   75 mg at 06/03/22 2159   traZODone (DESYREL) tablet 100 mg  100 mg Oral QHS PRN Sarina Ill, DO        Lab Results:  Results for orders placed or performed during the hospital encounter of 06/02/22 (from the past 48 hour(s))  Glucose, capillary     Status: Abnormal   Collection Time: 06/02/22 12:29 PM  Result Value Ref Range   Glucose-Capillary 150 (H) 70 - 99 mg/dL    Comment: Glucose reference range applies only to samples taken after fasting for at least 8 hours.  Glucose, capillary     Status: Abnormal   Collection Time: 06/02/22  7:48 PM  Result Value Ref Range   Glucose-Capillary 191 (H) 70 - 99 mg/dL    Comment: Glucose reference range applies only to samples  taken after fasting for at least 8 hours.  Glucose, capillary     Status: Abnormal   Collection Time: 06/03/22  7:48 AM  Result Value Ref Range   Glucose-Capillary 131 (H) 70 - 99 mg/dL    Comment: Glucose reference range applies only to  samples taken after fasting for at least 8 hours.  Glucose, capillary     Status: Abnormal   Collection Time: 06/03/22 11:38 AM  Result Value Ref Range   Glucose-Capillary 150 (H) 70 - 99 mg/dL    Comment: Glucose reference range applies only to samples taken after fasting for at least 8 hours.  Glucose, capillary     Status: Abnormal   Collection Time: 06/03/22  4:24 PM  Result Value Ref Range   Glucose-Capillary 182 (H) 70 - 99 mg/dL    Comment: Glucose reference range applies only to samples taken after fasting for at least 8 hours.  Glucose, capillary     Status: Abnormal   Collection Time: 06/03/22  7:20 PM  Result Value Ref Range   Glucose-Capillary 135 (H) 70 - 99 mg/dL    Comment: Glucose reference range applies only to samples taken after fasting for at least 8 hours.  Glucose, capillary     Status: Abnormal   Collection Time: 06/04/22  7:10 AM  Result Value Ref Range   Glucose-Capillary 116 (H) 70 - 99 mg/dL    Comment: Glucose reference range applies only to samples taken after fasting for at least 8 hours.    Blood Alcohol level:  Lab Results  Component Value Date   ETH <10 06/01/2022    Metabolic Disorder Labs: Lab Results  Component Value Date   HGBA1C 8.5 (H) 11/17/2012   No results found for: "PROLACTIN" No results found for: "CHOL", "TRIG", "HDL", "CHOLHDL", "VLDL", "LDLCALC"  Physical Findings: AIMS:  , ,  ,  ,    CIWA:    COWS:     Musculoskeletal: Strength & Muscle Tone: within normal limits Gait & Station: unsteady Patient leans: Front  Psychiatric Specialty Exam:  Presentation  General Appearance: Appropriate for Environment; Casual; Neat  Eye Contact:Fair  Speech:Slow  Speech  Volume:Decreased  Handedness:Right   Mood and Affect  Mood:Anxious; Depressed; Irritable  Affect:Constricted   Thought Process  Thought Processes:Disorganized  Descriptions of Associations:Loose  Orientation:Partial  Thought Content: some paranoia  History of Schizophrenia/Schizoaffective disorder:No  Duration of Psychotic Symptoms:Greater than six months  Hallucinations:Hallucinations: None  Ideas of Reference:Paranoia  Suicidal Thoughts:Suicidal Thoughts: No  Homicidal Thoughts:Homicidal Thoughts: No   Sensorium  Memory:Immediate Fair; Remote Good  Judgment:Impaired  Insight:Fair   Executive Functions  Concentration:Poor  Attention Span:Fair  Recall:Fair  Fund of Knowledge:Fair  Language:Fair   Psychomotor Activity  Psychomotor Activity:Psychomotor Activity: Psychomotor Retardation   Assets  Assets:Communication Skills; Desire for Improvement; Financial Resources/Insurance; Housing; Social Support   Sleep  Sleep:Sleep: Fair    Physical Exam: Physical Exam ROS Blood pressure 122/68, pulse 84, temperature 98.6 F (37 C), temperature source Oral, resp. rate 16, height 6' (1.829 m), SpO2 100 %. Body mass index is 23.73 kg/m.   Treatment Plan Summary: Daily contact with patient to assess and evaluate symptoms and progress in treatment, Medication management, and Plan Continue current medicines and doses.   Leo Rod 06/04/2022, 10:49 AM

## 2022-06-05 LAB — GLUCOSE, CAPILLARY
Glucose-Capillary: 133 mg/dL — ABNORMAL HIGH (ref 70–99)
Glucose-Capillary: 145 mg/dL — ABNORMAL HIGH (ref 70–99)

## 2022-06-05 NOTE — Progress Notes (Signed)
[facility] MD Progress Note  06/05/2022 11:34 AM Jeffery Beck  MRN:  [id number] Subjective: Jeffery Beck is seen on rounds.  He has been compliant with his medication and denies any side effects.  His thought process is much clearer.  He is sleeping well and his appetite is improved.  He still is tangential and starts talking about politics but then comes back and returns to the conversation at hand.  He is agreeable to seeing a psychiatrist when he leaves and states that it does not have to be at the [place].  He does attend the [facility].  He states that he has been on Pamelor, Remeron, Lyrica, and trazodone.  He is currently doing well on Remeron and Zyprexa.  Principal Problem: Mood disorder (HCC) Diagnosis: Principal Problem:   Mood disorder (HCC)  Total Time spent with patient: 15 minutes  Past Psychiatric History: He has 1 previous psychiatric admission back in 2014 by Dr. Toni Amend.  He has a history of bipolar disorder and has been on Effexor, lithium, nortriptyline, Lyrica, Remeron and trazodone.   Past Medical History:  Past Medical History:  Diagnosis Date   Chronic back pain    Hypertension    Osteoarthritis     Past Surgical History:  Procedure Laterality Date   2 heart stents     back fusion     BACK SURGERY     trial back stimulator for chronic back pain   Family History: No family history on file.  Social History:  Social History   Substance and Sexual Activity  Alcohol Use Not Currently     Social History   Substance and Sexual Activity  Drug Use Yes   Comment: prescribed tramadol    Social History   Socioeconomic History   Marital status: Divorced    Spouse name: Not on file   Number of children: Not on file   Years of education: Not on file   Highest education level: Not on file  Occupational History   Not on file  Tobacco Use   Smoking status: Former    Types: Cigarettes   Smokeless tobacco: Never  Vaping Use   Vaping Use: Never used  Substance and Sexual  Activity   Alcohol use: Not Currently   Drug use: Yes    Comment: prescribed tramadol   Sexual activity: Not on file  Other Topics Concern   Not on file  Social History Narrative   Not on file   Social Determinants of Health   Financial Resource Strain: Not on file  Food Insecurity: Not on file  Transportation Needs: Not on file  Physical Activity: Not on file  Stress: Not on file  Social Connections: Not on file   Additional Social History:                         Sleep: Fair  Appetite:  Fair  Current Medications: Current Facility-Administered Medications  Medication Dose Route Frequency Provider Last Rate Last Admin   acetaminophen (TYLENOL) tablet 650 mg  650 mg Oral Q6H PRN Gillermo Murdoch, NP   650 mg at 06/02/22 0534   alum & mag hydroxide-simeth (MAALOX/MYLANTA) 200-200-20 MG/5ML suspension 30 mL  30 mL Oral Q4H PRN Gillermo Murdoch, NP       atorvastatin (LIPITOR) tablet 10 mg  10 mg Oral QPM Gillermo Murdoch, NP   10 mg at 06/04/22 1625   clonazePAM (KLONOPIN) tablet 0.5 mg  0.5 mg Oral BH-q8a4p Elane Fritz  Edward, DO   0.5 mg at 06/05/22 0815   haloperidol (HALDOL) tablet 5 mg  5 mg Oral Q6H PRN Sarina Ill, DO       Or   haloperidol lactate (HALDOL) injection 10 mg  10 mg Intramuscular Q6H PRN Sarina Ill, DO       insulin glargine-yfgn [facility]) injection 22 Units  22 Units Subcutaneous QHS Sarina Ill, DO   22 Units at 06/04/22 2128   lisinopril (ZESTRIL) tablet 10 mg  10 mg Oral QPC breakfast Sarina Ill, DO   10 mg at 06/05/22 0815   LORazepam (ATIVAN) tablet 1 mg  1 mg Oral Q4H PRN Sarina Ill, DO       Or   LORazepam (ATIVAN) injection 1 mg  1 mg Intramuscular Q4H PRN Sarina Ill, DO       magnesium hydroxide (MILK OF MAGNESIA) suspension 30 mL  30 mL Oral Daily PRN Gillermo Murdoch, NP       mirtazapine (REMERON) tablet 15 mg  15 mg Oral QHS Sarina Ill, DO   15 mg at 06/04/22 2125   naphazoline-pheniramine (NAPHCON-A) 0.025-0.3 % ophthalmic solution 1 drop  1 drop Both Eyes QID PRN Danelle Earthly, Kashif   1 drop at 06/05/22 0646   OLANZapine (ZYPREXA) tablet 10 mg  10 mg Oral QHS Sarina Ill, DO   10 mg at 06/04/22 2125   OLANZapine (ZYPREXA) tablet 5 mg  5 mg Oral BH-q8a4p Sarina Ill, DO   5 mg at 06/05/22 0815   pantoprazole (PROTONIX) EC tablet 20 mg  20 mg Oral Daily Gillermo Murdoch, NP   20 mg at 06/05/22 0816   pregabalin (LYRICA) capsule 75 mg  75 mg Oral QHS Gillermo Murdoch, NP   75 mg at 06/04/22 2124   traZODone (DESYREL) tablet 100 mg  100 mg Oral QHS PRN Sarina Ill, DO        Lab Results:  Results for orders placed or performed during the hospital encounter of 06/02/22 (from the past 48 hour(s))  Glucose, capillary     Status: Abnormal   Collection Time: 06/03/22 11:38 AM  Result Value Ref Range   Glucose-Capillary 150 (H) 70 - 99 mg/dL    Comment: Glucose reference range applies only to samples taken after fasting for at least 8 hours.  Glucose, capillary     Status: Abnormal   Collection Time: 06/03/22  4:24 PM  Result Value Ref Range   Glucose-Capillary 182 (H) 70 - 99 mg/dL    Comment: Glucose reference range applies only to samples taken after fasting for at least 8 hours.  Glucose, capillary     Status: Abnormal   Collection Time: 06/03/22  7:20 PM  Result Value Ref Range   Glucose-Capillary 135 (H) 70 - 99 mg/dL    Comment: Glucose reference range applies only to samples taken after fasting for at least 8 hours.  Glucose, capillary     Status: Abnormal   Collection Time: 06/04/22  7:10 AM  Result Value Ref Range   Glucose-Capillary 116 (H) 70 - 99 mg/dL    Comment: Glucose reference range applies only to samples taken after fasting for at least 8 hours.  Glucose, capillary     Status: Abnormal   Collection Time: 06/04/22  7:55 PM  Result Value Ref Range    Glucose-Capillary 147 (H) 70 - 99 mg/dL    Comment: Glucose reference range applies only to samples taken after  fasting for at least 8 hours.  Glucose, capillary     Status: Abnormal   Collection Time: 06/05/22  7:35 AM  Result Value Ref Range   Glucose-Capillary 133 (H) 70 - 99 mg/dL    Comment: Glucose reference range applies only to samples taken after fasting for at least 8 hours.    Blood Alcohol level:  Lab Results  Component Value Date   ETH <10 06/01/2022    Metabolic Disorder Labs: Lab Results  Component Value Date   HGBA1C 8.5 (H) 11/17/2012   No results found for: "PROLACTIN" No results found for: "CHOL", "TRIG", "HDL", "CHOLHDL", "VLDL", "LDLCALC"  Physical Findings: AIMS:  , ,  ,  ,    CIWA:    COWS:     Musculoskeletal: Strength & Muscle Tone: within normal limits Gait & Station: unsteady Patient leans: N/A  Psychiatric Specialty Exam:  Presentation  General Appearance: Appropriate for Environment; Casual; Neat  Eye Contact:Fair  Speech:Slow  Speech Volume:Decreased  Handedness:Right   Mood and Affect  Mood:Anxious; Depressed; Irritable  Affect:Constricted   Thought Process  Thought Processes:Disorganized  Descriptions of Associations:Loose  Orientation:Partial  Thought Content:Paranoid Ideation; Illogical  History of Schizophrenia/Schizoaffective disorder:No  Duration of Psychotic Symptoms:Greater than six months  Hallucinations:No data recorded Ideas of Reference:Paranoia  Suicidal Thoughts:No data recorded Homicidal Thoughts:No data recorded  Sensorium  Memory:Immediate Fair; Remote Good  Judgment:Impaired  Insight:Fair   Executive Functions  Concentration:Poor  Attention Span:Fair  Recall:Fair  Fund of Knowledge:Fair  Language:Fair   Psychomotor Activity  Psychomotor Activity:No data recorded  Assets  Assets:Communication Skills; Desire for Improvement; Financial Resources/Insurance; Housing; Social  Support   Sleep  Sleep:No data recorded   Physical Exam: Physical Exam Vitals and nursing note reviewed.  Constitutional:      Appearance: Normal appearance. He is normal weight.  Neurological:     General: No focal deficit present.     Mental Status: He is alert and oriented to person, place, and time.  Psychiatric:        Attention and Perception: Attention and perception normal.        Mood and Affect: Mood is depressed. Affect is flat.        Speech: Speech is tangential.        Behavior: Behavior normal. Behavior is cooperative.        Thought Content: Thought content is paranoid.        Cognition and Memory: Cognition is impaired. Memory is impaired.        Judgment: Judgment normal.    Review of Systems  Constitutional: Negative.   HENT: Negative.    Eyes: Negative.   Respiratory: Negative.    Cardiovascular: Negative.   Gastrointestinal: Negative.   Genitourinary: Negative.   Musculoskeletal: Negative.   Skin: Negative.   Neurological: Negative.   Endo/Heme/Allergies: Negative.   Psychiatric/Behavioral: Negative.     Blood pressure (!) 143/78, pulse 91, temperature 97.7 F (36.5 C), resp. rate 16, height 6' (1.829 m), SpO2 99 %. Body mass index is 23.73 kg/m.   Treatment Plan Summary: Daily contact with patient to assess and evaluate symptoms and progress in treatment, Medication management, and Plan continue current medications.  PT to evaluate.  Sarina Ill, DO 06/05/2022, 11:34 AM

## 2022-06-05 NOTE — Progress Notes (Signed)
Patient is alert and oriented times 4. Mood and affect appropriate. Patient rates pain as 0/10. She denies SI, HI, and AVH. Patient does endorse feelings of anxiety and depression at this time. Patient states she slept well last night. Evening medicines administered whole by mouth without difficulty. Patient ate snack in day room; appetite was fair. Patient remains on unit with Q15 minute checks in place.   

## 2022-06-05 NOTE — Plan of Care (Signed)
  Problem: Health Behavior/Discharge Planning: Goal: Ability to manage health-related needs will improve Outcome: Progressing   Problem: Activity: Goal: Risk for activity intolerance will decrease Outcome: Progressing   Problem: Coping: Goal: Level of anxiety will decrease Outcome: Not Progressing   

## 2022-06-05 NOTE — Progress Notes (Signed)
   D:  Patient alert and oriented x 4.  Patient presents with sad, anxious affect.  Patient reports he slept well.  Endorsees anxiety after having failed attempts to reach family members by phone.  Endorses small level of depression. Denies SI/HI and AVH. Denies pain.  Patient is paranoid and expresses concerns that people are monitoring him.    A:  Medications administered as ordered.  Emotional support and encouragement offered.  Frequent verbal interaction with patient.  Q15 minute checks in place for safety.   R:  Patient compliant with medications.  No adverse reactions noted.  Patient contracts for safety. Appropriate interaction with peers. Patient is present in the milieu.

## 2022-06-06 MED ORDER — CLONAZEPAM 0.25 MG PO TBDP
0.2500 mg | ORAL_TABLET | ORAL | Status: DC
  Administered 2022-06-06 – 2022-06-08 (×4): 0.25 mg via ORAL
  Filled 2022-06-06 (×4): qty 1

## 2022-06-06 MED ORDER — OLANZAPINE 5 MG PO TABS
10.0000 mg | ORAL_TABLET | ORAL | Status: DC
Start: 2022-06-06 — End: 2022-06-07
  Administered 2022-06-06 – 2022-06-07 (×2): 10 mg via ORAL
  Filled 2022-06-06 (×2): qty 2

## 2022-06-06 NOTE — Progress Notes (Signed)
Patient alert and oriented x 4. Patient with sad, flat affect.  Patient denies SI/HI and AVH.  When asked about his anxiety and depression patient states, "Its all gone.  I just want to go home."   Denies pain.  Patient is cooperative and pleasant with staff,   Scheduled medications administered as ordered. No adverse reactions noted.    15 minute checks in place.  Patient remains safe on the unit.   Patient present in the milieu, but will return to his room when the milieu becomes hostile.  Appropriate interaction with peers and staff.

## 2022-06-06 NOTE — Plan of Care (Signed)
  Problem: Activity: Goal: Risk for activity intolerance will decrease Outcome: Progressing   Problem: Nutrition: Goal: Adequate nutrition will be maintained Outcome: Progressing   Problem: Coping: Goal: Level of anxiety will decrease Outcome: Progressing   Problem: Safety: Goal: Ability to remain free from injury will improve Outcome: Progressing   

## 2022-06-06 NOTE — Evaluation (Signed)
Physical Therapy Evaluation Patient Details Name: Jeffery Beck MRN: [id number] DOB: 1951-05-10 Today's Date: 06/06/2022  History of Present Illness  Pt is 57 YOM admitted (IVC) for AMS, paranoia, delusion, and mood disorder. PMH includes: BPD, post-concussion syndrome, occipital neuralgia B, spinal cord stimulator, depression, chronic neck/back pain, HTN, OA.  Clinical Impression  Pt presents to PT in recliner and agreeable to participate in therapy services.  Pt was pleasant and motivated to participate during the session and put forth good effort throughout. Pt amb well, experienced no LOB, and does not require physical assistance with any aspect of functional mobility during today's evaluation. Reports that he does sometimes experience dizziness upon positional changes that usually resolve with time. Pt stated is currently working with personal trainer on strength, gait, and balance through the [place] and wishes to continue that upon hospital d/c.    Recommendations for follow up therapy are one component of a multi-disciplinary discharge planning process, led by the attending physician.  Recommendations may be updated based on patient status, additional functional criteria and insurance authorization.  Follow Up Recommendations  Continue w [facility] per above    Assistance Recommended at Discharge PRN  Patient can return home with the following  Assistance with cooking/housework;Assist for transportation    Equipment Recommendations None recommended by PT  Recommendations for Other Services       Functional Status Assessment Patient has had a recent decline in their functional status and demonstrates the ability to make significant improvements in function in a reasonable and predictable amount of time.     Precautions / Restrictions Precautions Precautions: Fall Restrictions Weight Bearing Restrictions: No      Mobility  Bed Mobility               General bed  mobility comments: pt received/left in recliner    Transfers Overall transfer level: Supervision Equipment used: Rolling walker (2 wheels)                    Ambulation/Gait Ambulation/Gait assistance: Supervision, Min guard Gait Distance (Feet): 200 Feet 1x150 w RW, 1x50 w/o RW Assistive device: Rolling walker (2 wheels) Gait Pattern/deviations: Narrow base of support, Step-through pattern Gait velocity: decr     General Gait Details: without RW, slight sway and generally less steady. Requires CGA for amb w/o RW, but SBA for amb w RW  Stairs            Wheelchair Mobility    Modified Rankin (Stroke Patients Only)       Balance Overall balance assessment: Mild deficits observed, not formally tested                                           Pertinent Vitals/Pain Pain Assessment Pain Assessment: 0-10 Pain Score: 8  Pain Location: pt reports 8/10 pain at all times Pain Descriptors / Indicators: Aching, Constant Pain Intervention(s): Limited activity within patient's tolerance, Monitored during session    Home Living Family/patient expects to be discharged to:: Private residence Living Arrangements: Parent;Other relatives Available Help at Discharge: Family;Available PRN/intermittently Type of Home: House Home Access: Stairs to enter Entrance Stairs-Rails: Left Entrance Stairs-Number of Steps: 2 STE   Home Layout: Two level Home Equipment: Cane - single point;Shower seat;Rollator (4 wheels);Rolling Walker (2 wheels);Wheelchair - manual      Prior Function Prior Level of Function : Independent/Modified  Independent             Mobility Comments: pt reports full community amb w RW, but prefers motorized scooter d/t pain. For household distances, rarely uses any AD.       Hand Dominance        Extremity/Trunk Assessment   Upper Extremity Assessment Upper Extremity Assessment: Generalized weakness    Lower Extremity  Assessment Lower Extremity Assessment: Generalized weakness    Cervical / Trunk Assessment Cervical / Trunk Assessment: Normal  Communication   Communication: No difficulties  Cognition Arousal/Alertness: Awake/alert Behavior During Therapy: WFL for tasks assessed/performed Overall Cognitive Status: Within Functional Limits for tasks assessed                                          General Comments      Exercises Other Exercises Other Exercises: RW fit adjustment edu   Assessment/Plan    PT Assessment Patient needs continued PT services  PT Problem List Decreased strength;Decreased balance;Decreased mobility;Decreased knowledge of use of DME;Pain       PT Treatment Interventions      PT Goals (Current goals can be found in the Care Plan section)  Acute Rehab PT Goals Patient Stated Goal: to get stronger and decr pain PT Goal Formulation: With patient Time For Goal Achievement: 06/19/22 Potential to Achieve Goals: Good    Frequency       Co-evaluation               AM-PAC PT "6 Clicks" Mobility  Outcome Measure Help needed turning from your back to your side while in a flat bed without using bedrails?: None Help needed moving from lying on your back to sitting on the side of a flat bed without using bedrails?: None Help needed moving to and from a bed to a chair (including a wheelchair)?: None Help needed standing up from a chair using your arms (e.g., wheelchair or bedside chair)?: None Help needed to walk in hospital room?: A Little Help needed climbing 3-5 steps with a railing? : A Little 6 Click Score: 22    End of Session Equipment Utilized During Treatment: Gait belt Activity Tolerance: Patient tolerated treatment well Patient left: in chair;with nursing/sitter in room Nurse Communication: Mobility status PT Visit Diagnosis: Muscle weakness (generalized) (M62.81)    Time: 2725-3664 PT Time Calculation (min) (ACUTE ONLY): 25  min   Charges:              Odette Horns MPH, SPT 06/06/22, 11:03 AM

## 2022-06-06 NOTE — Progress Notes (Signed)
[facility] MD Progress Note  06/06/2022 10:32 AM Jeffery Beck  MRN:  [id number] Subjective: Jeffery Beck is seen on rounds.  He is doing much better.  He has been compliant with his medications and his thought process has greatly improved.  He is no longer talking politics in a tangential inappropriate way.  He is much more goal directed and linear in his thinking.  No side effects from the medication.  Principal Problem: Mood disorder (HCC) Diagnosis: Principal Problem:   Mood disorder (HCC)  Total Time spent with patient: 15 minutes  Past Psychiatric History: He has 1 previous psychiatric admission back in 2014 by Dr. Toni Amend.  He has a history of bipolar disorder and has been on Effexor, lithium, nortriptyline, Lyrica, Remeron and trazodone.   Past Medical History:  Past Medical History:  Diagnosis Date   Chronic back pain    Hypertension    Osteoarthritis     Past Surgical History:  Procedure Laterality Date   2 heart stents     back fusion     BACK SURGERY     trial back stimulator for chronic back pain   Family History: No family history on file.  Social History:  Social History   Substance and Sexual Activity  Alcohol Use Not Currently     Social History   Substance and Sexual Activity  Drug Use Yes   Comment: prescribed tramadol    Social History   Socioeconomic History   Marital status: Divorced    Spouse name: Not on file   Number of children: Not on file   Years of education: Not on file   Highest education level: Not on file  Occupational History   Not on file  Tobacco Use   Smoking status: Former    Types: Cigarettes   Smokeless tobacco: Never  Vaping Use   Vaping Use: Never used  Substance and Sexual Activity   Alcohol use: Not Currently   Drug use: Yes    Comment: prescribed tramadol   Sexual activity: Not on file  Other Topics Concern   Not on file  Social History Narrative   Not on file   Social Determinants of Health   Financial Resource  Strain: Not on file  Food Insecurity: Not on file  Transportation Needs: Not on file  Physical Activity: Not on file  Stress: Not on file  Social Connections: Not on file   Additional Social History:                         Sleep: Good  Appetite:  Good  Current Medications: Current Facility-Administered Medications  Medication Dose Route Frequency Provider Last Rate Last Admin   acetaminophen (TYLENOL) tablet 650 mg  650 mg Oral Q6H PRN Gillermo Murdoch, NP   650 mg at 06/02/22 0534   alum & mag hydroxide-simeth (MAALOX/MYLANTA) 200-200-20 MG/5ML suspension 30 mL  30 mL Oral Q4H PRN Gillermo Murdoch, NP       atorvastatin (LIPITOR) tablet 10 mg  10 mg Oral QPM Gillermo Murdoch, NP   10 mg at 06/05/22 1707   clonazePAM (KLONOPIN) disintegrating tablet 0.25 mg  0.25 mg Oral BH-q8a4p [name], Lanny Cramp, DO       haloperidol (HALDOL) tablet 5 mg  5 mg Oral Q6H PRN Sarina Ill, DO       Or   haloperidol lactate (HALDOL) injection 10 mg  10 mg Intramuscular Q6H PRN Sarina Ill, DO  insulin glargine-yfgn (SEMGLEE) injection 22 Units  22 Units Subcutaneous QHS Sarina Ill, DO   22 Units at 06/05/22 2216   lisinopril (ZESTRIL) tablet 10 mg  10 mg Oral QPC breakfast Sarina Ill, DO   10 mg at 06/06/22 0859   LORazepam (ATIVAN) tablet 1 mg  1 mg Oral Q4H PRN Sarina Ill, DO       Or   LORazepam (ATIVAN) injection 1 mg  1 mg Intramuscular Q4H PRN Sarina Ill, DO       magnesium hydroxide (MILK OF MAGNESIA) suspension 30 mL  30 mL Oral Daily PRN Gillermo Murdoch, NP       mirtazapine (REMERON) tablet 15 mg  15 mg Oral QHS Sarina Ill, DO   15 mg at 06/05/22 2215   naphazoline-pheniramine (NAPHCON-A) 0.025-0.3 % ophthalmic solution 1 drop  1 drop Both Eyes QID PRN Danelle Earthly, Kashif   1 drop at 06/06/22 0859   OLANZapine (ZYPREXA) tablet 10 mg  10 mg Oral BH-qamhs Willoughby Doell  Edward, DO       pantoprazole (PROTONIX) EC tablet 20 mg  20 mg Oral Daily Gillermo Murdoch, NP   20 mg at 06/06/22 0859   pregabalin (LYRICA) capsule 75 mg  75 mg Oral QHS Gillermo Murdoch, NP   75 mg at 06/05/22 2213   traZODone (DESYREL) tablet 100 mg  100 mg Oral QHS PRN Sarina Ill, DO        Lab Results:  Results for orders placed or performed during the hospital encounter of 06/02/22 (from the past 48 hour(s))  Glucose, capillary     Status: Abnormal   Collection Time: 06/04/22  7:55 PM  Result Value Ref Range   Glucose-Capillary 147 (H) 70 - 99 mg/dL    Comment: Glucose reference range applies only to samples taken after fasting for at least 8 hours.  Glucose, capillary     Status: Abnormal   Collection Time: 06/05/22  7:35 AM  Result Value Ref Range   Glucose-Capillary 133 (H) 70 - 99 mg/dL    Comment: Glucose reference range applies only to samples taken after fasting for at least 8 hours.  Glucose, capillary     Status: Abnormal   Collection Time: 06/05/22  8:10 PM  Result Value Ref Range   Glucose-Capillary 145 (H) 70 - 99 mg/dL    Comment: Glucose reference range applies only to samples taken after fasting for at least 8 hours.   Comment 1 Notify RN     Blood Alcohol level:  Lab Results  Component Value Date   ETH <10 06/01/2022    Metabolic Disorder Labs: Lab Results  Component Value Date   HGBA1C 8.5 (H) 11/17/2012   No results found for: "PROLACTIN" No results found for: "CHOL", "TRIG", "HDL", "CHOLHDL", "VLDL", "LDLCALC"  Physical Findings: AIMS:  , ,  ,  ,    CIWA:    COWS:     Musculoskeletal: Strength & Muscle Tone: within normal limits Gait & Station: normal Patient leans: N/A  Psychiatric Specialty Exam:  Presentation  General Appearance: Appropriate for Environment; Casual; Neat  Eye Contact:Fair  Speech:Slow  Speech Volume:Decreased  Handedness:Right   Mood and Affect  Mood:Anxious; Depressed;  Irritable  Affect:Constricted   Thought Process  Thought Processes:Disorganized  Descriptions of Associations:Loose  Orientation:Partial  Thought Content:Paranoid Ideation; Illogical  History of Schizophrenia/Schizoaffective disorder:No  Duration of Psychotic Symptoms:Greater than six months  Hallucinations:No data recorded Ideas of Reference:Paranoia  Suicidal Thoughts:No data recorded  Homicidal Thoughts:No data recorded  Sensorium  Memory:Immediate Fair; Remote Good  Judgment:Impaired  Insight:Fair   Executive Functions  Concentration:Poor  Attention Span:Fair  Recall:Fair  Fund of Knowledge:Fair  Language:Fair   Psychomotor Activity  Psychomotor Activity:No data recorded  Assets  Assets:Communication Skills; Desire for Improvement; Financial Resources/Insurance; Housing; Social Support   Sleep  Sleep:No data recorded   Physical Exam: Physical Exam Vitals and nursing note reviewed.  Constitutional:      Appearance: Normal appearance. He is normal weight.  Neurological:     General: No focal deficit present.     Mental Status: He is alert and oriented to person, place, and time.  Psychiatric:        Attention and Perception: Attention and perception normal.        Mood and Affect: Mood is depressed. Affect is flat.        Speech: Speech normal.        Behavior: Behavior normal. Behavior is cooperative.        Thought Content: Thought content normal.        Cognition and Memory: Cognition and memory normal.        Judgment: Judgment normal.    Review of Systems  Constitutional: Negative.   HENT: Negative.    Eyes: Negative.   Respiratory: Negative.    Cardiovascular: Negative.   Gastrointestinal: Negative.   Genitourinary: Negative.   Musculoskeletal: Negative.   Skin: Negative.   Neurological: Negative.   Endo/Heme/Allergies: Negative.   Psychiatric/Behavioral: Negative.     Blood pressure 132/82, pulse 91, temperature 98.6 F  (37 C), temperature source Oral, resp. rate 18, height 6' (1.829 m), SpO2 99 %. Body mass index is 23.73 kg/m.   Treatment Plan Summary: Daily contact with patient to assess and evaluate symptoms and progress in treatment, Medication management, and Plan decrease Klonopin to 0.25 mg twice a day.  Change Zyprexa to 10 mg in the morning and 10 mg at bedtime.  Hemoglobin A1c and lipid panel in the morning.  Sarina Ill, DO 06/06/2022, 10:32 AM

## 2022-06-06 NOTE — BHH Counselor (Signed)
CSW spoke with the patient on the incorrect number to reach his daughter.  Patient reports that he is aware and is working on identifying a correct number.  Penni Homans, MSW, LCSW 06/06/2022 11:48 AM

## 2022-06-06 NOTE — Group Note (Signed)
[facility] LCSW Group Therapy Note   Group Date: 06/06/2022 Start Time: 1330 End Time: 1430  Type of Therapy/Topic:  Group Therapy:  Feelings about Diagnosis  Participation Level:  Active   Description of Group:    This group will allow patients to explore their thoughts and feelings about diagnoses they have received. Patients will be guided to explore their level of understanding and acceptance of these diagnoses. Facilitator will encourage patients to process their thoughts and feelings about the reactions of others to their diagnosis, and will guide patients in identifying ways to discuss their diagnosis with significant others in their lives. This group will be process-oriented, with patients participating in exploration of their own experiences as well as giving and receiving support and challenge from other group members.   Therapeutic Goals: 1. Patient will demonstrate understanding of diagnosis as evidence by identifying two or more symptoms of the disorder:  2. Patient will be able to express two feelings regarding the diagnosis 3. Patient will demonstrate ability to communicate their needs through discussion and/or role plays  Summary of Patient Progress: Patient was an active participant in group. Patient was supportive of others. Patient did express some tangential, loose thinking, however was able to be redirected to the subject matter.  Patient displayed fair insight when focused.    Therapeutic Modalities:   Cognitive Behavioral Therapy Brief Therapy Feelings Identification    Harden Mo, LCSW

## 2022-06-06 NOTE — BH Assessment (Signed)
1930 Patient in the day room socializing with other patient's while watching TV. He is a little irritable because he said his eyes are burning he requested and received Visine Eyedrops.   2220 Patient has a small verbal altercation with a male patient. He was becoming very aggressive but able to be redirected.He was encouraged not to ignore the other patient if possible.   2350 Patient was heard yelling out in his room when patient was asked was he ok he stated " I was having a nightmare.   7341 Patient was noted sitting in the chair in the hallway he was asked; why are you sitting in that chair? Patient stated " I was having another nightmare." Patient comforted and informed that his sleep pattern would be share with the provider.   0630 Patient has rested quietly without any further complaints of nightmares. This Clinical research associate will continue to monitor patient for safety.

## 2022-06-07 LAB — GLUCOSE, CAPILLARY
Glucose-Capillary: 159 mg/dL — ABNORMAL HIGH (ref 70–99)
Glucose-Capillary: 197 mg/dL — ABNORMAL HIGH (ref 70–99)

## 2022-06-07 LAB — LIPID PANEL
Cholesterol: 118 mg/dL (ref 0–200)
HDL: 28 mg/dL — ABNORMAL LOW (ref >40)
LDL Cholesterol: 66 mg/dL (ref 0–99)
Total CHOL/HDL Ratio: 4.2 RATIO
Triglycerides: 120 mg/dL (ref <150)
VLDL: 24 mg/dL (ref 0–40)

## 2022-06-07 LAB — HEMOGLOBIN A1C
Hgb A1c MFr Bld: 7 % — ABNORMAL HIGH (ref 4.8–5.6)
Mean Plasma Glucose: 154.2 mg/dL

## 2022-06-07 MED ORDER — OLANZAPINE 5 MG PO TABS
15.0000 mg | ORAL_TABLET | Freq: Every day | ORAL | Status: DC
  Administered 2022-06-07 – 2022-06-08 (×2): 15 mg via ORAL
  Filled 2022-06-07 (×2): qty 3

## 2022-06-07 NOTE — Group Note (Signed)
[facility] LCSW Group Therapy Note   Group Date: 06/07/2022 Start Time: 1330 End Time: 1415   Type of Therapy/Topic:  Group Therapy:  Emotion Regulation  Participation Level:  Active   Mood:  Description of Group:    The purpose of this group is to assist patients in learning to regulate negative emotions and experience positive emotions. Patients will be guided to discuss ways in which they have been vulnerable to their negative emotions. These vulnerabilities will be juxtaposed with experiences of positive emotions or situations, and patients challenged to use positive emotions to combat negative ones. Special emphasis will be placed on coping with negative emotions in conflict situations, and patients will process healthy conflict resolution skills.  Therapeutic Goals: Patient will identify two positive emotions or experiences to reflect on in order to balance out negative emotions:  Patient will label two or more emotions that they find the most difficult to experience:  Patient will be able to demonstrate positive conflict resolution skills through discussion or role plays:   Summary of Patient Progress: Patient arrived to group late.  Patient engaged in group when he arrived. Patient was able to engage in discussion on coping skills.  Patient was appropriate and supportive of others.    Therapeutic Modalities:   Cognitive Behavioral Therapy Feelings Identification Dialectical Behavioral Therapy   Harden Mo, LCSW

## 2022-06-07 NOTE — Progress Notes (Signed)
  Room Change Patient advised of room change due to an intrusive Peer on the current bed area. Per Pt "I cannot sleep well at night he is making a lot of noise"   Support and encouragement provided.

## 2022-06-07 NOTE — Progress Notes (Signed)
[facility] MD Progress Note  06/07/2022 11:10 AM Jeffery Beck  MRN:  [id number] Subjective: Jeffery Beck is seen on rounds.  He still has moments of confusion but for the most part he is clear pretty well.  He has been compliant with his medications and denies any side effects.  There is no evidence of EPS or TD.  He is still mildly paranoid.  His lipid panel was good.  His hemoglobin A1c is around 8.5.  He does have the diabetes.  Principal Problem: Mood disorder (HCC) Diagnosis: Principal Problem:   Mood disorder (HCC)  Total Time spent with patient: 15 minutes  Past Psychiatric History: He has 1 previous psychiatric admission back in 2014 by Dr. Toni Amend.  He has a history of bipolar disorder and has been on Effexor, lithium, nortriptyline, Lyrica, Remeron and trazodone.   Past Medical History:  Past Medical History:  Diagnosis Date   Chronic back pain    Hypertension    Osteoarthritis     Past Surgical History:  Procedure Laterality Date   2 heart stents     back fusion     BACK SURGERY     trial back stimulator for chronic back pain   Family History: No family history on file.  Social History:  Social History   Substance and Sexual Activity  Alcohol Use Not Currently     Social History   Substance and Sexual Activity  Drug Use Yes   Comment: prescribed tramadol    Social History   Socioeconomic History   Marital status: Divorced    Spouse name: Not on file   Number of children: Not on file   Years of education: Not on file   Highest education level: Not on file  Occupational History   Not on file  Tobacco Use   Smoking status: Former    Types: Cigarettes   Smokeless tobacco: Never  Vaping Use   Vaping Use: Never used  Substance and Sexual Activity   Alcohol use: Not Currently   Drug use: Yes    Comment: prescribed tramadol   Sexual activity: Not on file  Other Topics Concern   Not on file  Social History Narrative   Not on file   Social Determinants of  Health   Financial Resource Strain: Not on file  Food Insecurity: Not on file  Transportation Needs: Not on file  Physical Activity: Not on file  Stress: Not on file  Social Connections: Not on file   Additional Social History:                         Sleep: Good  Appetite:  Good  Current Medications: Current Facility-Administered Medications  Medication Dose Route Frequency Provider Last Rate Last Admin   acetaminophen (TYLENOL) tablet 650 mg  650 mg Oral Q6H PRN Gillermo Murdoch, NP   650 mg at 06/02/22 0534   alum & mag hydroxide-simeth (MAALOX/MYLANTA) 200-200-20 MG/5ML suspension 30 mL  30 mL Oral Q4H PRN Gillermo Murdoch, NP       atorvastatin (LIPITOR) tablet 10 mg  10 mg Oral QPM Gillermo Murdoch, NP   10 mg at 06/06/22 1706   clonazePAM (KLONOPIN) disintegrating tablet 0.25 mg  0.25 mg Oral BH-q8a4p Sarina Ill, DO   0.25 mg at 06/07/22 0809   haloperidol (HALDOL) tablet 5 mg  5 mg Oral Q6H PRN Sarina Ill, DO       Or   haloperidol lactate (HALDOL) injection  10 mg  10 mg Intramuscular Q6H PRN Sarina Ill, DO       insulin glargine-yfgn [facility]) injection 22 Units  22 Units Subcutaneous QHS Sarina Ill, DO   22 Units at 06/06/22 2211   lisinopril (ZESTRIL) tablet 10 mg  10 mg Oral QPC breakfast Sarina Ill, DO   10 mg at 06/07/22 0809   LORazepam (ATIVAN) tablet 1 mg  1 mg Oral Q4H PRN Sarina Ill, DO       Or   LORazepam (ATIVAN) injection 1 mg  1 mg Intramuscular Q4H PRN Sarina Ill, DO       magnesium hydroxide (MILK OF MAGNESIA) suspension 30 mL  30 mL Oral Daily PRN Gillermo Murdoch, NP       mirtazapine (REMERON) tablet 15 mg  15 mg Oral QHS Sarina Ill, DO   15 mg at 06/06/22 2209   naphazoline-pheniramine (NAPHCON-A) 0.025-0.3 % ophthalmic solution 1 drop  1 drop Both Eyes QID PRN Danelle Earthly, Kashif   1 drop at 06/06/22 0859   OLANZapine (ZYPREXA)  tablet 10 mg  10 mg Oral BH-qamhs Sarina Ill, DO   10 mg at 06/07/22 0809   pantoprazole (PROTONIX) EC tablet 20 mg  20 mg Oral Daily Gillermo Murdoch, NP   20 mg at 06/07/22 0809   pregabalin (LYRICA) capsule 75 mg  75 mg Oral QHS Gillermo Murdoch, NP   75 mg at 06/06/22 2209   traZODone (DESYREL) tablet 100 mg  100 mg Oral QHS PRN Sarina Ill, DO        Lab Results:  Results for orders placed or performed during the hospital encounter of 06/02/22 (from the past 48 hour(s))  Glucose, capillary     Status: Abnormal   Collection Time: 06/05/22  8:10 PM  Result Value Ref Range   Glucose-Capillary 145 (H) 70 - 99 mg/dL    Comment: Glucose reference range applies only to samples taken after fasting for at least 8 hours.   Comment 1 Notify RN   Lipid panel     Status: Abnormal   Collection Time: 06/07/22  6:22 AM  Result Value Ref Range   Cholesterol 118 0 - 200 mg/dL   Triglycerides 326 <712 mg/dL   HDL 28 (L) >45 mg/dL   Total CHOL/HDL Ratio 4.2 RATIO   VLDL 24 0 - 40 mg/dL   LDL Cholesterol 66 0 - 99 mg/dL    Comment:        Total Cholesterol/HDL:CHD Risk Coronary Heart Disease Risk Table                     Men   Women  1/2 Average Risk   3.4   3.3  Average Risk       5.0   4.4  2 X Average Risk   9.6   7.1  3 X Average Risk  23.4   11.0        Use the calculated Patient Ratio above and the CHD Risk Table to determine the patient's CHD Risk.        ATP III CLASSIFICATION (LDL):  <100     mg/dL   Optimal  809-983  mg/dL   Near or Above                    Optimal  130-159  mg/dL   Borderline  382-505  mg/dL   High  >397     mg/dL  Very High Performed at [facility], [address] [place], [place] [address]   Glucose, capillary     Status: Abnormal   Collection Time: 06/07/22  7:30 AM  Result Value Ref Range   Glucose-Capillary 159 (H) 70 - 99 mg/dL    Comment: Glucose reference range applies only to samples taken after  fasting for at least 8 hours.    Blood Alcohol level:  Lab Results  Component Value Date   ETH <10 06/01/2022    Metabolic Disorder Labs: Lab Results  Component Value Date   HGBA1C 8.5 (H) 11/17/2012   No results found for: "PROLACTIN" Lab Results  Component Value Date   CHOL 118 06/07/2022   TRIG 120 06/07/2022   HDL 28 (L) 06/07/2022   CHOLHDL 4.2 06/07/2022   VLDL 24 06/07/2022   LDLCALC 66 06/07/2022    Physical Findings: AIMS:  , ,  ,  ,    CIWA:    COWS:     Musculoskeletal: Strength & Muscle Tone: within normal limits Gait & Station: normal Patient leans: N/A  Psychiatric Specialty Exam:  Presentation  General Appearance: Appropriate for Environment; Casual; Neat  Eye Contact:Fair  Speech:Slow  Speech Volume:Decreased  Handedness:Right   Mood and Affect  Mood:Anxious; Depressed; Irritable  Affect:Constricted   Thought Process  Thought Processes:Disorganized  Descriptions of Associations:Loose  Orientation:Partial  Thought Content:Paranoid Ideation; Illogical  History of Schizophrenia/Schizoaffective disorder:No  Duration of Psychotic Symptoms:Greater than six months  Hallucinations:No data recorded Ideas of Reference:Paranoia  Suicidal Thoughts:No data recorded Homicidal Thoughts:No data recorded  Sensorium  Memory:Immediate Fair; Remote Good  Judgment:Impaired  Insight:Fair   Executive Functions  Concentration:Poor  Attention Span:Fair  Recall:Fair  Fund of Knowledge:Fair  Language:Fair   Psychomotor Activity  Psychomotor Activity:No data recorded  Assets  Assets:Communication Skills; Desire for Improvement; Financial Resources/Insurance; Housing; Social Support   Sleep  Sleep:No data recorded   Physical Exam: Physical Exam Vitals and nursing note reviewed.  Constitutional:      Appearance: Normal appearance. He is normal weight.  Neurological:     General: No focal deficit present.     Mental  Status: He is alert and oriented to person, place, and time.  Psychiatric:        Attention and Perception: Attention and perception normal.        Mood and Affect: Affect normal. Mood is depressed.        Speech: Speech normal.        Behavior: Behavior normal. Behavior is cooperative.        Thought Content: Thought content is paranoid.        Cognition and Memory: Memory normal. Cognition is impaired.        Judgment: Judgment normal.    Review of Systems  Constitutional: Negative.   HENT: Negative.    Eyes: Negative.   Respiratory: Negative.    Cardiovascular: Negative.   Gastrointestinal: Negative.   Genitourinary: Negative.   Musculoskeletal: Negative.   Skin: Negative.   Neurological: Negative.   Endo/Heme/Allergies: Negative.   Psychiatric/Behavioral: Negative.     Blood pressure (!) 141/86, pulse 86, temperature 98.5 F (36.9 C), temperature source Oral, resp. rate 18, height 6' (1.829 m), SpO2 99 %. Body mass index is 23.73 kg/m.   Treatment Plan Summary: Daily contact with patient to assess and evaluate symptoms and progress in treatment, Medication management, and Plan change Zyprexa to 15 mg at bedtime only.  Plan on discontinuing Klonopin tomorrow.  Treena Cosman Tresea Mall, DO  06/07/2022, 11:10 AM

## 2022-06-07 NOTE — Plan of Care (Signed)
Pt progressing

## 2022-06-07 NOTE — BH IP Treatment Plan (Signed)
Interdisciplinary Treatment and Diagnostic Plan Update  06/07/2022 Time of Session: 8:45 Am  Jeffery Beck MRN: [id number]  Principal Diagnosis: Mood disorder [facility])  Secondary Diagnoses: Principal Problem:   Mood disorder (HCC)   Current Medications:  Current Facility-Administered Medications  Medication Dose Route Frequency Provider Last Rate Last Admin   acetaminophen (TYLENOL) tablet 650 mg  650 mg Oral Q6H PRN Gillermo Murdoch, NP   650 mg at 06/02/22 0534   alum & mag hydroxide-simeth (MAALOX/MYLANTA) 200-200-20 MG/5ML suspension 30 mL  30 mL Oral Q4H PRN Gillermo Murdoch, NP       atorvastatin (LIPITOR) tablet 10 mg  10 mg Oral QPM Gillermo Murdoch, NP   10 mg at 06/06/22 1706   clonazePAM (KLONOPIN) disintegrating tablet 0.25 mg  0.25 mg Oral BH-[id number] Sarina Ill, DO   0.25 mg at 06/07/22 0809   haloperidol (HALDOL) tablet 5 mg  5 mg Oral Q6H PRN Sarina Ill, DO       Or   haloperidol lactate (HALDOL) injection 10 mg  10 mg Intramuscular Q6H PRN Sarina Ill, DO       insulin glargine-yfgn [facility]) injection 22 Units  22 Units Subcutaneous QHS Sarina Ill, DO   22 Units at 06/06/22 2211   lisinopril (ZESTRIL) tablet 10 mg  10 mg Oral QPC breakfast Sarina Ill, DO   10 mg at 06/07/22 0809   LORazepam (ATIVAN) tablet 1 mg  1 mg Oral Q4H PRN Sarina Ill, DO       Or   LORazepam (ATIVAN) injection 1 mg  1 mg Intramuscular Q4H PRN Sarina Ill, DO       magnesium hydroxide (MILK OF MAGNESIA) suspension 30 mL  30 mL Oral Daily PRN Gillermo Murdoch, NP       mirtazapine (REMERON) tablet 15 mg  15 mg Oral QHS Sarina Ill, DO   15 mg at 06/06/22 2209   naphazoline-pheniramine (NAPHCON-A) 0.025-0.3 % ophthalmic solution 1 drop  1 drop Both Eyes QID PRN Malik, Kashif   1 drop at 06/06/22 0859   OLANZapine (ZYPREXA) tablet 10 mg  10 mg Oral BH-qamhs Sarina Ill, DO   10  mg at 06/07/22 0809   pantoprazole (PROTONIX) EC tablet 20 mg  20 mg Oral Daily Gillermo Murdoch, NP   20 mg at 06/07/22 0809   pregabalin (LYRICA) capsule 75 mg  75 mg Oral QHS Gillermo Murdoch, NP   75 mg at 06/06/22 2209   traZODone (DESYREL) tablet 100 mg  100 mg Oral QHS PRN Sarina Ill, DO       PTA Medications: Medications Prior to Admission  Medication Sig Dispense Refill Last Dose   atorvastatin (LIPITOR) 10 MG tablet Take by mouth.      dapagliflozin propanediol (FARXIGA) 5 MG TABS tablet Take by mouth.      Insulin Glargine (LANTUS Rote) Inject 22 Units into the skin at bedtime.      LISINOPRIL PO Take by mouth.      MIRTAZAPINE PO Take 1 tablet by mouth at bedtime.      nortriptyline (PAMELOR) 10 MG capsule Take 10 mg by mouth at bedtime.      pantoprazole (PROTONIX) 20 MG tablet Take 1 tablet (20 mg total) by mouth daily for 14 days. 14 tablet 0    pregabalin (LYRICA) 75 MG capsule Take 75 mg by mouth at bedtime.      Semaglutide (OZEMPIC, 1 MG/DOSE, Soso) Inject 1 mg into  the skin once a week.      traMADol (ULTRAM) 50 MG tablet Take 50 mg by mouth as needed.      traZODone (DESYREL) 100 MG tablet Take 100 mg by mouth at bedtime.      VENLAFAXINE HCL PO Take by mouth.       Patient Stressors:    Patient Strengths:    Treatment Modalities: Medication Management, Group therapy, Case management,  1 to 1 session with clinician, Psychoeducation, Recreational therapy.   Physician Treatment Plan for Primary Diagnosis: Mood disorder (HCC) Long Term Goal(s): Improvement in symptoms so as ready for discharge   Short Term Goals: Ability to identify changes in lifestyle to reduce recurrence of condition will improve Ability to verbalize feelings will improve Ability to disclose and discuss suicidal ideas Ability to demonstrate self-control will improve Ability to identify and develop effective coping behaviors will improve Ability to maintain clinical  measurements within normal limits will improve Compliance with prescribed medications will improve Ability to identify triggers associated with substance abuse/mental health issues will improve  Medication Management: Evaluate patient's response, side effects, and tolerance of medication regimen.  Therapeutic Interventions: 1 to 1 sessions, Unit Group sessions and Medication administration.  Evaluation of Outcomes: Progressing  Physician Treatment Plan for Secondary Diagnosis: Principal Problem:   Mood disorder (HCC)  Long Term Goal(s): Improvement in symptoms so as ready for discharge   Short Term Goals: Ability to identify changes in lifestyle to reduce recurrence of condition will improve Ability to verbalize feelings will improve Ability to disclose and discuss suicidal ideas Ability to demonstrate self-control will improve Ability to identify and develop effective coping behaviors will improve Ability to maintain clinical measurements within normal limits will improve Compliance with prescribed medications will improve Ability to identify triggers associated with substance abuse/mental health issues will improve     Medication Management: Evaluate patient's response, side effects, and tolerance of medication regimen.  Therapeutic Interventions: 1 to 1 sessions, Unit Group sessions and Medication administration.  Evaluation of Outcomes: Progressing   RN Treatment Plan for Primary Diagnosis: Mood disorder (HCC) Long Term Goal(s): Knowledge of disease and therapeutic regimen to maintain health will improve  Short Term Goals: Ability to demonstrate self-control, Ability to participate in decision making will improve, Ability to verbalize feelings will improve, Ability to disclose and discuss suicidal ideas, Ability to identify and develop effective coping behaviors will improve, and Compliance with prescribed medications will improve  Medication Management: RN will administer  medications as ordered by provider, will assess and evaluate patient's response and provide education to patient for prescribed medication. RN will report any adverse and/or side effects to prescribing provider.  Therapeutic Interventions: 1 on 1 counseling sessions, Psychoeducation, Medication administration, Evaluate responses to treatment, Monitor vital signs and CBGs as ordered, Perform/monitor CIWA, COWS, AIMS and Fall Risk screenings as ordered, Perform wound care treatments as ordered.  Evaluation of Outcomes: Progressing   LCSW Treatment Plan for Primary Diagnosis: Mood disorder (HCC) Long Term Goal(s): Safe transition to appropriate next level of care at discharge, Engage patient in therapeutic group addressing interpersonal concerns.  Short Term Goals: Engage patient in aftercare planning with referrals and resources, Increase social support, Increase ability to appropriately verbalize feelings, Increase emotional regulation, Facilitate acceptance of mental health diagnosis and concerns, and Increase skills for wellness and recovery  Therapeutic Interventions: Assess for all discharge needs, 1 to 1 time with Social worker, Explore available resources and support systems, Assess for adequacy in community support network, Educate  family and significant other(s) on suicide prevention, Complete Psychosocial Assessment, Interpersonal group therapy.  Evaluation of Outcomes: Progressing   Progress in Treatment: Attending groups: Yes. Participating in groups: Yes. Taking medication as prescribed: Yes. Toleration medication: Yes. Family/Significant other contact made: Yes, individual(s) contacted:  SPE attempts have been made but the number is not in working order.  Patient understands diagnosis: Yes. Discussing patient identified problems/goals with staff: Yes. Medical problems stabilized or resolved: Yes. Denies suicidal/homicidal ideation: Yes. Issues/concerns per patient  self-inventory: No. Other: none  New problem(s) identified: No, Describe:  none Update 06/07/2022:  No changes at this time.    New Short Term/Long Term Goal(s): elimination of symptoms of psychosis, medication management for mood stabilization; elimination of SI thoughts; development of comprehensive mental wellness plan. Update 06/07/2022:  No changes at this time.    Patient Goals:  "I need someone to go in and gently clean the home, it has roaches"Update 06/07/2022:  No changes at this time.    Discharge Plan or Barriers: CSW to assist patient in development of appropriate discharge plans. Update 06/07/2022:  Patient is open to a referral for mental health outpatient and CSW to assist with scheduling that.  CSW will continue to develop appropriate discharge plans with the patient.   Reason for Continuation of Hospitalization: Anxiety Depression Medical Issues Medication stabilization   Estimated Length of Stay:  1-7 days Update 06/07/2022:  No changes at this time.   Last 3 [place] Suicide Severity Risk Score: Flowsheet Row Admission (Current) from 06/02/2022 in [facility] BEHAVIORAL MEDICINE ED from 06/01/2022 in [facility] EMERGENCY DEPARTMENT ED from 05/29/2022 in [facility] EMERGENCY DEPARTMENT  C-SSRS RISK CATEGORY No Risk No Risk No Risk       Last PHQ 2/9 Scores:     No data to display          Scribe for Treatment Team: Harden Mo, Alexander Mt 06/07/2022 9:16 AM

## 2022-06-07 NOTE — Progress Notes (Signed)
PT Cancellation Note  Patient Details Name: Jeffery Beck MRN: [id number] DOB: 1951/05/23   Cancelled Treatment:    Reason Eval/Treat Not Completed: Other (comment): Per nursing pt just received dinner and will not be available for PT for another 30 min.  Will attempt to see pt at a future date/time as medically appropriate.    Ovidio Hanger PT, DPT 06/07/22, 4:44 PM

## 2022-06-08 LAB — GLUCOSE, CAPILLARY: Glucose-Capillary: 281 mg/dL — ABNORMAL HIGH (ref 70–99)

## 2022-06-08 MED ORDER — GLUCERNA SHAKE PO LIQD
237.0000 mL | Freq: Three times a day (TID) | ORAL | Status: DC
Start: 2022-06-08 — End: 2022-06-09
  Administered 2022-06-08 – 2022-06-09 (×4): 237 mL via ORAL

## 2022-06-08 NOTE — BHH Group Notes (Signed)
Patients were educated on the impact of positive reframing, and given examples of how positive reframing can help with coping and behavioral impacts. Pt's were then read poem by Jacquelyne Balint ''[facility] [place]'' in which the brain is used as a comparison with the fight or flight reaction to stress/fear.  Pt was attentive and appropriate during group.
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
## 2022-06-08 NOTE — BHH Group Notes (Signed)
Pt attended recreation outside with music and karaoke. Pt attended.  

## 2022-06-08 NOTE — Progress Notes (Signed)
Pt presents with anxious mood, affect congruent. Jeffery Beck continues to have multiple somatic complaints this am, and appears anxious and tearful. He complained of body aches, dry eyes, then feeling stomach upset/gassy. Prn's given for above complaints. Pt is pleasant and eating and drinking well and attending to his own adl's. Patient is able to make his needs known. Overall patient is appropriate and interactive on the unit, he smiles and jokes with staff and appears much improved from prior shifts working with this patient. 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 Patient is safe, will con't to monitor.

## 2022-06-08 NOTE — Group Note (Signed)
BHH LCSW Group Therapy Note   Group Date: 06/08/2022 Start Time: 1300 End Time: 1400   Type of Therapy/Topic:  Group Therapy:  Balance in Life  Participation Level:  Did Not Attend   Description of Group:    This group will address the concept of balance and how it feels and looks when one is unbalanced. Patients will be encouraged to process areas in their lives that are out of balance, and identify reasons for remaining unbalanced. Facilitators will guide patients utilizing problem- solving interventions to address and correct the stressor making their life unbalanced. Understanding and applying boundaries will be explored and addressed for obtaining  and maintaining a balanced life. Patients will be encouraged to explore ways to assertively make their unbalanced needs known to significant others in their lives, using other group members and facilitator for support and feedback.  Therapeutic Goals: Patient will identify two or more emotions or situations they have that consume much of in their lives. Patient will identify signs/triggers that life has become out of balance:  Patient will identify two ways to set boundaries in order to achieve balance in their lives:  Patient will demonstrate ability to communicate their needs through discussion and/or role plays  Summary of Patient Progress:    Group not held due to complex discharge planning.    Therapeutic Modalities:   Cognitive Behavioral Therapy Solution-Focused Therapy Assertiveness Training   Matisse Salais J Arsh Feutz, LCSW 

## 2022-06-09 MED ORDER — TRAZODONE HCL 100 MG PO TABS: 100.0000 mg | ORAL_TABLET | Freq: Every evening | ORAL | 3 refills | Status: DC | PRN

## 2022-06-09 MED ORDER — OLANZAPINE 15 MG PO TABS: 15.0000 mg | ORAL_TABLET | Freq: Every day | ORAL | 3 refills | Status: DC

## 2022-06-09 MED ORDER — MIRTAZAPINE 15 MG PO TABS: 15.0000 mg | ORAL_TABLET | Freq: Every day | ORAL | 3 refills | Status: AC

## 2022-06-09 NOTE — Plan of Care (Signed)
Patient is anxious and has flat affect. Speech is clear. AOX4 . Denies SI/HI. Q15 checks were done on a patient throughout the shift. VS are stable..Compliant to medications. Patient complained of insomnia and pain. on his neck  Pt was given PRNs, tylenol 650 mg and trazodone 100 mg. Medications were effective. Patient was sleeping most of the shift on his bed with eyes closed. Ate meals and snacks. No other issues.

## 2022-06-09 NOTE — Progress Notes (Signed)
  [facility] Adult Case Management Discharge Plan :  Will you be returning to the same living situation after discharge:  Yes,  pt reports that she is returning home. At discharge, do you have transportation home?: Yes,  pt reports that he has his car at the hospital and can drive himself home Do you have the ability to pay for your medications: Yes,  [employer] [facility] [employer] [employer]  Release of information consent forms completed and in the chart;  Patient's signature needed at discharge.  Patient to Follow up at:  Follow-up Information     [facility] Follow up.   Specialty: Behavioral Health Why: Appointment is scheduled for August 14, 2022 at 1:30PM, please arrive by 1:00 PM.  Appointment is with Dr. Vanetta Shawl. Contact information: [address] [facility] [place] [address] [phone]                Next level of care provider has access to [facility] Link:no  Safety Planning and Suicide Prevention discussed: Yes,  SPE completed with the patient.      Has patient been referred to the Quitline?: Patient refused referral  Patient has been referred for addiction treatment: N/A  Harden Mo, LCSW 06/09/2022, 9:08 AM

## 2022-06-09 NOTE — Progress Notes (Signed)
Patient discharged from [facility] unit at 1325 escorted by staff in a wheelchair to personal vehicle. Nursing discussed discharge packet with patient and patient verbalized understanding. He denies SI/HI/AVH. All belongings packed up and sent along. In addition, his eyeglasses were on his person and cane was held in his hand. No distress noted.

## 2022-06-09 NOTE — BHH Suicide Risk Assessment (Signed)
[facility] Discharge Suicide Risk Assessment   Principal Problem: Mood disorder [facility]) Discharge Diagnoses: Principal Problem:   Mood disorder (HCC)   Total Time spent with patient: 1 hour  Musculoskeletal: Strength & Muscle Tone: within normal limits Gait & Station: normal Patient leans: N/A  Psychiatric Specialty Exam  Presentation  General Appearance: Appropriate for Environment; Casual; Neat  Eye Contact:Fair  Speech:Slow  Speech Volume:Decreased  Handedness:Right   Mood and Affect  Mood:Anxious; Depressed; Irritable  Duration of Depression Symptoms: No data recorded Affect:Constricted   Thought Process  Thought Processes:Disorganized  Descriptions of Associations:Loose  Orientation:Partial  Thought Content:Paranoid Ideation; Illogical  History of Schizophrenia/Schizoaffective disorder:No  Duration of Psychotic Symptoms:Greater than six months  Hallucinations:No data recorded Ideas of Reference:Paranoia  Suicidal Thoughts:No data recorded Homicidal Thoughts:No data recorded  Sensorium  Memory:Immediate Fair; Remote Good  Judgment:Impaired  Insight:Fair   Executive Functions  Concentration:Poor  Attention Span:Fair  Recall:Fair  Fund of Knowledge:Fair  Language:Fair   Psychomotor Activity  Psychomotor Activity:No data recorded  Assets  Assets:Communication Skills; Desire for Improvement; Financial Resources/Insurance; Housing; Social Support   Sleep  Sleep:No data recorded   Blood pressure 137/74, pulse 82, temperature 97.6 F (36.4 C), temperature source Oral, resp. rate 18, height 6' (1.829 m), SpO2 100 %. Body mass index is 23.73 kg/m.  Mental Status Per Nursing Assessment::   On Admission:  Belief that plan would result in death, NA  Demographic Factors:  Male, Age 72 or older, and Caucasian  Loss Factors: NA  Historical Factors: NA  Risk Reduction Factors:   Living with another person, especially a relative,  Positive social support, Positive therapeutic relationship, and Positive coping skills or problem solving skills  Continued Clinical Symptoms:  Depression:   Delusional  Cognitive Features That Contribute To Risk:  Loss of executive function and Thought constriction (tunnel vision)    Suicide Risk:  Minimal: No identifiable suicidal ideation.  Patients presenting with no risk factors but with morbid ruminations; may be classified as minimal risk based on the severity of the depressive symptoms   Follow-up Information     [facility] Follow up.   Specialty: Behavioral Health Why: Appointment is scheduled for August 14, 2022 at 1:30PM, please arrive by 1:00 PM.  Appointment is with Dr. Vanetta Shawl. Contact information: [address] Medical [facility] [place] [address] [phone]                Plan Of Care/Follow-up recommendations: Dr. Chapman Fitch, DO 06/09/2022, 11:04 AM

## 2022-06-09 NOTE — Discharge Summary (Signed)
Physician Discharge Summary Note  Patient:  Jeffery Beck is an 71 y.o., male MRN:  [id number] DOB:  09-04-1951 Patient phone:  [phone] (home)  Patient address:   [address] [place] [address],  Total Time spent with patient: 1 hour  Date of Admission:  06/02/2022 Date of Discharge: 06/09/2022  Reason for Admission:  Jeffery Beck is a 71 year old white male who initially presented to the emergency room with abdominal pain and a history of colitis.  He was discharged and returned 4 days later with mental status changes on involuntary commitment.  He was paranoid and delusional.  He is extremely poor historian.  It was difficult to get a history from him but apparently he lives with his 59 year old father and his brother in [place].  He is followed by the [facility] outpatient.  He rambles about politics and arguing with his family.  He is currently on disability.  He gets very agitated when you ask him simple questions like who is her doctor.  He has been married twice and divorced twice.  He was admitted to [facility] back in 2014.  He denies all signs and symptoms although he presents very confused, paranoid, agitated, and possibly psychotic.   PER INITIAL INTAKE: Jeffery Beck is a 71 y.o. male patient presented to [facility] ED via POV under involuntary commitment status (IVC). The patient was seen in our ED about three days ago. After reading the patient assessment, it sounded like he was doing well. The patient is delusional and paranoid and is not quite doing well mentally.   The patient does appear to be responding to internal and external stimuli. The patient is presenting with some delusional thinking. The patient denies auditory or visual hallucinations. The patient denies any suicidal, homicidal, or self-harm ideations. The patient is presenting with some psychotic and paranoid behaviors. During an encounter with the patient, he could not answer questions appropriately.  Principal Problem:  Mood disorder [facility]) Discharge Diagnoses: Principal Problem:   Mood disorder [facility])   Past Psychiatric History:  He has 1 previous psychiatric admission back in 2014 by Dr. Toni Amend.  He has a history of bipolar disorder and has been on Effexor, lithium, nortriptyline, Lyrica, Remeron and trazodone.   Past Medical History:  Past Medical History:  Diagnosis Date   Chronic back pain    Hypertension    Osteoarthritis     Past Surgical History:  Procedure Laterality Date   2 heart stents     back fusion     BACK SURGERY     trial back stimulator for chronic back pain   Family History: No family history on file. Family Psychiatric  History: Unremarkable Social History:  Social History   Substance and Sexual Activity  Alcohol Use Not Currently     Social History   Substance and Sexual Activity  Drug Use Yes   Comment: prescribed tramadol    Social History   Socioeconomic History   Marital status: Divorced    Spouse name: Not on file   Number of children: Not on file   Years of education: Not on file   Highest education level: Not on file  Occupational History   Not on file  Tobacco Use   Smoking status: Former    Types: Cigarettes   Smokeless tobacco: Never  Vaping Use   Vaping Use: Never used  Substance and Sexual Activity   Alcohol use: Not Currently   Drug use: Yes    Comment: prescribed tramadol  Sexual activity: Not on file  Other Topics Concern   Not on file  Social History Narrative   Not on file   Social Determinants of Health   Financial Resource Strain: Not on file  Food Insecurity: Not on file  Transportation Needs: Not on file  Physical Activity: Not on file  Stress: Not on file  Social Connections: Not on file    Hospital Course:  Jeffery Beck is a 71 year old white male who presented to the emergency room with abdominal pain with mental status changes.  He was involuntarily admitted to geriatric psychiatry with confusion and paranoia and  agitation.  He has been medically cleared.  He was initiated on Zyprexa and Klonopin.  Klonopin was tapered off and Remeron was started at nighttime.  He signed voluntary paperwork.  He had been on Pamelor but did well with the Remeron and Zyprexa.  His Zyprexa was tapered and changed to 15 mg at bedtime.  His confusion resolved and his thought process was much more linear.  He was pleasant and cooperative throughout his stay.  He interacted well with staff and peers.  He was compliant with his medications and denies any side effects.  There is no evidence of EPS or TD.  His CMP and CBC were within normal limits.  His hemoglobin A1c was 7.0.  Urine drug screen was negative except for tricyclic.  Lipid panel was within normal limits.  His sleep improved and his appetite improved.  It was felt that he maximized hospitalization he was discharged home.  On the day of discharge, he denied suicidal ideation, homicidal ideation, auditory or visual hallucinations.  His judgment was good.  Insight was good. Mood and affect improved and so did his thought process  Physical Findings: AIMS:  , ,  ,  ,    CIWA:    COWS:     Musculoskeletal: Strength & Muscle Tone: within normal limits Gait & Station: normal Patient leans: N/A   Psychiatric Specialty Exam:  Presentation  General Appearance: Appropriate for Environment; Casual; Neat  Eye Contact:Fair  Speech:Slow  Speech Volume:Decreased  Handedness:Right   Mood and Affect  Mood:Anxious; Depressed; Irritable  Affect:Constricted   Thought Process  Thought Processes:Disorganized  Descriptions of Associations:Loose  Orientation:Partial  Thought Content:Paranoid Ideation; Illogical  History of Schizophrenia/Schizoaffective disorder:No  Duration of Psychotic Symptoms:Greater than six months  Hallucinations:No data recorded Ideas of Reference:Paranoia  Suicidal Thoughts:No data recorded Homicidal Thoughts:No data recorded  Sensorium   Memory:Immediate Fair; Remote Good  Judgment:Impaired  Insight:Fair   Executive Functions  Concentration:Poor  Attention Span:Fair  Recall:Fair  Fund of Knowledge:Fair  Language:Fair   Psychomotor Activity  Psychomotor Activity:No data recorded  Assets  Assets:Communication Skills; Desire for Improvement; Financial Resources/Insurance; Housing; Social Support   Sleep  Sleep:No data recorded   Physical Exam: Physical Exam Vitals and nursing note reviewed.  Constitutional:      Appearance: Normal appearance. He is normal weight.  Neurological:     General: No focal deficit present.     Mental Status: He is alert and oriented to person, place, and time.  Psychiatric:        Attention and Perception: Attention and perception normal.        Mood and Affect: Mood and affect normal.        Speech: Speech normal.        Behavior: Behavior normal. Behavior is cooperative.        Thought Content: Thought content normal.  Cognition and Memory: Cognition and memory normal.        Judgment: Judgment normal.    Review of Systems  Constitutional: Negative.   HENT: Negative.    Eyes: Negative.   Respiratory: Negative.    Cardiovascular: Negative.   Gastrointestinal: Negative.   Genitourinary: Negative.   Musculoskeletal: Negative.   Skin: Negative.   Neurological: Negative.   Endo/Heme/Allergies: Negative.   Psychiatric/Behavioral: Negative.     Blood pressure 137/74, pulse 82, temperature 97.6 F (36.4 C), temperature source Oral, resp. rate 18, height 6' (1.829 m), SpO2 100 %. Body mass index is 23.73 kg/m.   Social History   Tobacco Use  Smoking Status Former   Types: Cigarettes  Smokeless Tobacco Never   Tobacco Cessation:  N/A, patient does not currently use tobacco products   Blood Alcohol level:  Lab Results  Component Value Date   ETH <10 06/01/2022    Metabolic Disorder Labs:  Lab Results  Component Value Date   HGBA1C 7.0 (H)  06/07/2022   MPG 154.2 06/07/2022   No results found for: "PROLACTIN" Lab Results  Component Value Date   CHOL 118 06/07/2022   TRIG 120 06/07/2022   HDL 28 (L) 06/07/2022   CHOLHDL 4.2 06/07/2022   VLDL 24 06/07/2022   LDLCALC 66 06/07/2022    See Psychiatric Specialty Exam and Suicide Risk Assessment completed by Attending Physician prior to discharge.  Discharge destination:  Home  Is patient on multiple antipsychotic therapies at discharge:  No   Has Patient had three or more failed trials of antipsychotic monotherapy by history:  No  Recommended Plan for Multiple Antipsychotic Therapies: NA   Allergies as of 06/09/2022   No Known Allergies      Medication List     STOP taking these medications    nortriptyline 10 MG capsule Commonly known as: PAMELOR   traMADol 50 MG tablet Commonly known as: ULTRAM   VENLAFAXINE HCL PO       TAKE these medications      Indication  atorvastatin 10 MG tablet Commonly known as: LIPITOR Take by mouth.    dapagliflozin propanediol 5 MG Tabs tablet Commonly known as: FARXIGA Take by mouth.    LANTUS [place] Inject 22 Units into the skin at bedtime.    LISINOPRIL PO Take by mouth.    mirtazapine 15 MG tablet Commonly known as: REMERON Take 1 tablet (15 mg total) by mouth at bedtime. What changed:  medication strength how much to take  Indication: Major Depressive Disorder   OLANZapine 15 MG tablet Commonly known as: ZYPREXA Take 1 tablet (15 mg total) by mouth at bedtime.  Indication: Delirium, Major Depressive Disorder   OZEMPIC (1 MG/DOSE) [place] Inject 1 mg into the skin once a week.    pantoprazole 20 MG tablet Commonly known as: Protonix Take 1 tablet (20 mg total) by mouth daily for 14 days.    pregabalin 75 MG capsule Commonly known as: LYRICA Take 75 mg by mouth at bedtime.    traZODone 100 MG tablet Commonly known as: DESYREL Take 1 tablet (100 mg total) by mouth at bedtime as needed for  sleep. What changed:  when to take this reasons to take this  Indication: Trouble Sleeping, Major Depressive Disorder        Follow-up Information     [facility] Follow up.   Specialty: Behavioral Health Why: Appointment is scheduled for August 14, 2022 at 1:30PM, please arrive by 1:00 PM.  Appointment is with Dr. Vanetta Shawl. Contact information: [address] Medical [facility] [place] [address] [phone]                Follow-up recommendations:  Dr. Vanetta Shawl  Signed: Sarina Ill, DO 06/09/2022, 11:17 AM

## 2022-08-14 ENCOUNTER — Ambulatory Visit: Payer: No Typology Code available for payment source | Admitting: Psychiatry
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
## 2022-08-24 ENCOUNTER — Ambulatory Visit: Payer: No Typology Code available for payment source | Admitting: Psychiatry

## 2023-10-16 ENCOUNTER — Emergency Department
Admission: EM | Admit: 2023-10-16 | Discharge: 2023-10-16 | Disposition: A | Discharge status: Home / Self Care | Payer: No Typology Code available for payment source | Attending: Emergency Medicine | Admitting: Emergency Medicine

## 2023-10-16 ENCOUNTER — Emergency Department: Payer: No Typology Code available for payment source

## 2023-10-16 ENCOUNTER — Other Ambulatory Visit: Payer: Self-pay

## 2023-10-16 DIAGNOSIS — S0083XA Contusion of other part of head, initial encounter: Secondary | ICD-10-CM | POA: Insufficient documentation

## 2023-10-16 DIAGNOSIS — S0990XA Unspecified injury of head, initial encounter: Secondary | ICD-10-CM | POA: Insufficient documentation

## 2023-10-16 DIAGNOSIS — S0003XA Contusion of scalp, initial encounter: Secondary | ICD-10-CM | POA: Diagnosis not present

## 2023-10-16 LAB — CBG MONITORING, ED: Glucose-Capillary: 132 mg/dL — ABNORMAL HIGH (ref 70–99)
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
## 2023-10-16 NOTE — ED Notes (Signed)
 Patient discharged from ED by provider. Discharge instructions reviewed with patient and all questions answered. Patient wheeled from ED in NAD.

## 2023-10-16 NOTE — Discharge Instructions (Signed)
Please continue to monitor symptoms at home.  Follow-up with your primary care provider for any ongoing needs.
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
## 2023-10-22 ENCOUNTER — Emergency Department: Payer: No Typology Code available for payment source

## 2023-10-22 ENCOUNTER — Other Ambulatory Visit: Payer: Self-pay

## 2023-10-22 ENCOUNTER — Observation Stay: Payer: No Typology Code available for payment source

## 2023-10-22 ENCOUNTER — Encounter: Payer: Self-pay | Admitting: Emergency Medicine

## 2023-10-22 ENCOUNTER — Observation Stay
Admission: EM | Admit: 2023-10-22 | Discharge: 2023-10-23 | Disposition: A | Discharge status: Home / Self Care | Payer: No Typology Code available for payment source | Attending: Osteopathic Medicine | Admitting: Osteopathic Medicine

## 2023-10-22 DIAGNOSIS — D735 Infarction of spleen: Secondary | ICD-10-CM | POA: Diagnosis not present

## 2023-10-22 DIAGNOSIS — F39 Unspecified mood [affective] disorder: Secondary | ICD-10-CM | POA: Diagnosis present

## 2023-10-22 DIAGNOSIS — E663 Overweight: Secondary | ICD-10-CM | POA: Diagnosis not present

## 2023-10-22 DIAGNOSIS — R29810 Facial weakness: Secondary | ICD-10-CM

## 2023-10-22 DIAGNOSIS — R9082 White matter disease, unspecified: Secondary | ICD-10-CM | POA: Diagnosis not present

## 2023-10-22 DIAGNOSIS — H2512 Age-related nuclear cataract, left eye: Secondary | ICD-10-CM | POA: Insufficient documentation

## 2023-10-22 DIAGNOSIS — N281 Cyst of kidney, acquired: Secondary | ICD-10-CM | POA: Insufficient documentation

## 2023-10-22 DIAGNOSIS — N1832 Chronic kidney disease, stage 3b: Secondary | ICD-10-CM | POA: Diagnosis not present

## 2023-10-22 DIAGNOSIS — I129 Hypertensive chronic kidney disease with stage 1 through stage 4 chronic kidney disease, or unspecified chronic kidney disease: Secondary | ICD-10-CM | POA: Diagnosis not present

## 2023-10-22 DIAGNOSIS — I700 Atherosclerosis of aorta: Secondary | ICD-10-CM | POA: Insufficient documentation

## 2023-10-22 DIAGNOSIS — E1122 Type 2 diabetes mellitus with diabetic chronic kidney disease: Secondary | ICD-10-CM | POA: Diagnosis not present

## 2023-10-22 DIAGNOSIS — E119 Type 2 diabetes mellitus without complications: Secondary | ICD-10-CM

## 2023-10-22 DIAGNOSIS — Z7982 Long term (current) use of aspirin: Secondary | ICD-10-CM | POA: Diagnosis not present

## 2023-10-22 DIAGNOSIS — R911 Solitary pulmonary nodule: Secondary | ICD-10-CM | POA: Insufficient documentation

## 2023-10-22 DIAGNOSIS — Z8673 Personal history of transient ischemic attack (TIA), and cerebral infarction without residual deficits: Secondary | ICD-10-CM | POA: Diagnosis not present

## 2023-10-22 DIAGNOSIS — R112 Nausea with vomiting, unspecified: Secondary | ICD-10-CM | POA: Diagnosis present

## 2023-10-22 DIAGNOSIS — K551 Chronic vascular disorders of intestine: Secondary | ICD-10-CM | POA: Insufficient documentation

## 2023-10-22 DIAGNOSIS — Z87891 Personal history of nicotine dependence: Secondary | ICD-10-CM | POA: Diagnosis not present

## 2023-10-22 DIAGNOSIS — I2510 Atherosclerotic heart disease of native coronary artery without angina pectoris: Secondary | ICD-10-CM | POA: Diagnosis not present

## 2023-10-22 DIAGNOSIS — K5730 Diverticulosis of large intestine without perforation or abscess without bleeding: Secondary | ICD-10-CM | POA: Insufficient documentation

## 2023-10-22 DIAGNOSIS — Z1152 Encounter for screening for COVID-19: Secondary | ICD-10-CM | POA: Diagnosis not present

## 2023-10-22 DIAGNOSIS — Z951 Presence of aortocoronary bypass graft: Secondary | ICD-10-CM | POA: Diagnosis not present

## 2023-10-22 DIAGNOSIS — I517 Cardiomegaly: Secondary | ICD-10-CM | POA: Diagnosis not present

## 2023-10-22 DIAGNOSIS — K51919 Ulcerative colitis, unspecified with unspecified complications: Secondary | ICD-10-CM

## 2023-10-22 DIAGNOSIS — K5190 Ulcerative colitis, unspecified, without complications: Secondary | ICD-10-CM | POA: Diagnosis present

## 2023-10-22 DIAGNOSIS — Z794 Long term (current) use of insulin: Secondary | ICD-10-CM

## 2023-10-22 DIAGNOSIS — Z79899 Other long term (current) drug therapy: Secondary | ICD-10-CM | POA: Diagnosis not present

## 2023-10-22 DIAGNOSIS — K5904 Chronic idiopathic constipation: Secondary | ICD-10-CM | POA: Insufficient documentation

## 2023-10-22 LAB — BASIC METABOLIC PANEL
Anion gap: 17 — ABNORMAL HIGH (ref 5–15)
BUN: 29 mg/dL — ABNORMAL HIGH (ref 8–23)
CO2: 18 mmol/L — ABNORMAL LOW (ref 22–32)
Calcium: 9.3 mg/dL (ref 8.9–10.3)
Chloride: 105 mmol/L (ref 98–111)
Creatinine, Ser: 1.41 mg/dL — ABNORMAL HIGH (ref 0.61–1.24)
GFR, Estimated: 53 mL/min — ABNORMAL LOW (ref >60)
Glucose, Bld: 151 mg/dL — ABNORMAL HIGH (ref 70–99)
Potassium: 5.1 mmol/L (ref 3.5–5.1)
Sodium: 140 mmol/L (ref 135–145)

## 2023-10-22 LAB — PROTIME-INR
INR: 1 (ref 0.8–1.2)
Prothrombin Time: 13.3 s (ref 11.4–15.2)

## 2023-10-22 LAB — COMPREHENSIVE METABOLIC PANEL
ALT: 18 U/L (ref 0–44)
AST: 32 U/L (ref 15–41)
Albumin: 4.4 g/dL (ref 3.5–5.0)
Alkaline Phosphatase: 179 U/L — ABNORMAL HIGH (ref 38–126)
Anion gap: 17 — ABNORMAL HIGH (ref 5–15)
BUN: 31 mg/dL — ABNORMAL HIGH (ref 8–23)
CO2: 21 mmol/L — ABNORMAL LOW (ref 22–32)
Calcium: 9.8 mg/dL (ref 8.9–10.3)
Chloride: 101 mmol/L (ref 98–111)
Creatinine, Ser: 1.37 mg/dL — ABNORMAL HIGH (ref 0.61–1.24)
GFR, Estimated: 54 mL/min — ABNORMAL LOW (ref >60)
Glucose, Bld: 177 mg/dL — ABNORMAL HIGH (ref 70–99)
Potassium: 4.8 mmol/L (ref 3.5–5.1)
Sodium: 139 mmol/L (ref 135–145)
Total Bilirubin: 1.4 mg/dL — ABNORMAL HIGH (ref 0.0–1.2)
Total Protein: 9 g/dL — ABNORMAL HIGH (ref 6.5–8.1)

## 2023-10-22 LAB — CBC
HCT: 52.9 % — ABNORMAL HIGH (ref 39.0–52.0)
Hemoglobin: 16.6 g/dL (ref 13.0–17.0)
MCH: 28.5 pg (ref 26.0–34.0)
MCHC: 31.4 g/dL (ref 30.0–36.0)
MCV: 90.7 fL (ref 80.0–100.0)
Platelets: 472 10*3/uL — ABNORMAL HIGH (ref 150–400)
RBC: 5.83 MIL/uL — ABNORMAL HIGH (ref 4.22–5.81)
RDW: 15.5 % (ref 11.5–15.5)
WBC: 12.7 10*3/uL — ABNORMAL HIGH (ref 4.0–10.5)
nRBC: 0 % (ref 0.0–0.2)

## 2023-10-22 LAB — DIFFERENTIAL
Abs Immature Granulocytes: 0.04 10*3/uL (ref 0.00–0.07)
Basophils Absolute: 0 10*3/uL (ref 0.0–0.1)
Basophils Relative: 0 %
Eosinophils Absolute: 0 10*3/uL (ref 0.0–0.5)
Eosinophils Relative: 0 %
Immature Granulocytes: 0 %
Lymphocytes Relative: 4 %
Lymphs Abs: 0.5 10*3/uL — ABNORMAL LOW (ref 0.7–4.0)
Monocytes Absolute: 0.2 10*3/uL (ref 0.1–1.0)
Monocytes Relative: 1 %
Neutro Abs: 11.9 10*3/uL — ABNORMAL HIGH (ref 1.7–7.7)
Neutrophils Relative %: 95 %

## 2023-10-22 LAB — HEMOGLOBIN A1C
Hgb A1c MFr Bld: 8.6 % — ABNORMAL HIGH (ref 4.8–5.6)
Mean Plasma Glucose: 200.12 mg/dL

## 2023-10-22 LAB — ETHANOL: Alcohol, Ethyl (B): <10 mg/dL (ref <10)

## 2023-10-22 LAB — RESP PANEL BY RT-PCR (RSV, FLU A&B, COVID)  RVPGX2
Influenza A by PCR: NEGATIVE
Influenza B by PCR: NEGATIVE
Resp Syncytial Virus by PCR: NEGATIVE
SARS Coronavirus 2 by RT PCR: NEGATIVE

## 2023-10-22 LAB — CBG MONITORING, ED
Glucose-Capillary: 120 mg/dL — ABNORMAL HIGH (ref 70–99)
Glucose-Capillary: 151 mg/dL — ABNORMAL HIGH (ref 70–99)

## 2023-10-22 LAB — TROPONIN I (HIGH SENSITIVITY)
Troponin I (High Sensitivity): 17 ng/L (ref <18)
Troponin I (High Sensitivity): 15 ng/L (ref <18)

## 2023-10-22 LAB — APTT: aPTT: 27 s (ref 24–36)

## 2023-10-22 MED ORDER — ACETAMINOPHEN 650 MG RE SUPP
650.0000 mg | Freq: Four times a day (QID) | RECTAL | Status: DC | PRN
Start: 2023-10-22 — End: 2023-10-24

## 2023-10-22 MED ORDER — ENOXAPARIN SODIUM 40 MG/0.4ML IJ SOSY
40.0000 mg | PREFILLED_SYRINGE | INTRAMUSCULAR | Status: DC
Start: 2023-10-22 — End: 2023-10-24
  Administered 2023-10-22: 40 mg via SUBCUTANEOUS
  Filled 2023-10-22: qty 0.4

## 2023-10-22 MED ORDER — POLYETHYLENE GLYCOL 3350 17 G PO PACK: 17.0000 g | PACK | Freq: Every day | ORAL | Status: DC | PRN

## 2023-10-22 MED ORDER — ATORVASTATIN CALCIUM 20 MG PO TABS
80.0000 mg | ORAL_TABLET | Freq: Every day | ORAL | Status: DC
  Administered 2023-10-23: 80 mg via ORAL
  Filled 2023-10-22: qty 4

## 2023-10-22 MED ORDER — PREGABALIN 75 MG PO CAPS
75.0000 mg | ORAL_CAPSULE | Freq: Two times a day (BID) | ORAL | Status: DC
  Administered 2023-10-22 – 2023-10-23 (×2): 75 mg via ORAL
  Filled 2023-10-22 (×2): qty 1

## 2023-10-22 MED ORDER — ONDANSETRON HCL 4 MG PO TABS: 4.0000 mg | ORAL_TABLET | Freq: Four times a day (QID) | ORAL | Status: DC | PRN

## 2023-10-22 MED ORDER — NORTRIPTYLINE HCL 25 MG PO CAPS
25.0000 mg | ORAL_CAPSULE | Freq: Every day | ORAL | Status: DC
  Administered 2023-10-23: 25 mg via ORAL
  Filled 2023-10-22 (×3): qty 1

## 2023-10-22 MED ORDER — INSULIN ASPART 100 UNIT/ML IJ SOLN
0.0000 [IU] | Freq: Three times a day (TID) | INTRAMUSCULAR | Status: DC
  Administered 2023-10-23: 2 [IU] via SUBCUTANEOUS
  Filled 2023-10-22: qty 1

## 2023-10-22 MED ORDER — IOHEXOL 350 MG/ML SOLN
75.0000 mL | Freq: Once | INTRAVENOUS | Status: AC | PRN
  Administered 2023-10-22: 75 mL via INTRAVENOUS

## 2023-10-22 MED ORDER — METOCLOPRAMIDE HCL 5 MG/ML IJ SOLN
10.0000 mg | Freq: Once | INTRAMUSCULAR | Status: AC
  Administered 2023-10-22: 10 mg via INTRAVENOUS
  Filled 2023-10-22: qty 2

## 2023-10-22 MED ORDER — SODIUM CHLORIDE 0.9 % IV BOLUS
500.0000 mL | Freq: Once | INTRAVENOUS | Status: AC
  Administered 2023-10-22: 500 mL via INTRAVENOUS

## 2023-10-22 MED ORDER — BUSPIRONE HCL 10 MG PO TABS
5.0000 mg | ORAL_TABLET | Freq: Three times a day (TID) | ORAL | Status: DC | PRN
  Administered 2023-10-22: 5 mg via ORAL
  Filled 2023-10-22: qty 1

## 2023-10-22 MED ORDER — ONDANSETRON HCL 4 MG/2ML IJ SOLN
4.0000 mg | Freq: Once | INTRAMUSCULAR | Status: AC
  Administered 2023-10-22: 4 mg via INTRAVENOUS
  Filled 2023-10-22: qty 2

## 2023-10-22 MED ORDER — IOHEXOL 300 MG/ML  SOLN
100.0000 mL | Freq: Once | INTRAMUSCULAR | Status: AC | PRN
Start: 2023-10-22 — End: 2023-10-22
  Administered 2023-10-22: 100 mL via INTRAVENOUS

## 2023-10-22 MED ORDER — LISINOPRIL 20 MG PO TABS
20.0000 mg | ORAL_TABLET | Freq: Every day | ORAL | Status: DC
  Administered 2023-10-22 – 2023-10-23 (×2): 20 mg via ORAL
  Filled 2023-10-22: qty 2
  Filled 2023-10-22: qty 1

## 2023-10-22 MED ORDER — MIRTAZAPINE 15 MG PO TABS
15.0000 mg | ORAL_TABLET | Freq: Every day | ORAL | Status: DC
  Administered 2023-10-22: 15 mg via ORAL
  Filled 2023-10-22: qty 1

## 2023-10-22 MED ORDER — ONDANSETRON HCL 4 MG/2ML IJ SOLN
4.0000 mg | Freq: Four times a day (QID) | INTRAMUSCULAR | Status: DC | PRN
  Administered 2023-10-22: 4 mg via INTRAVENOUS
  Filled 2023-10-22: qty 2

## 2023-10-22 MED ORDER — LORAZEPAM 2 MG/ML IJ SOLN: 0.5000 mg | Freq: Once | INTRAMUSCULAR | Status: DC

## 2023-10-22 MED ORDER — SODIUM CHLORIDE 0.9 % IV BOLUS
1000.0000 mL | Freq: Once | INTRAVENOUS | Status: AC
  Administered 2023-10-22: 1000 mL via INTRAVENOUS

## 2023-10-22 MED ORDER — TAMSULOSIN HCL 0.4 MG PO CAPS
0.4000 mg | ORAL_CAPSULE | Freq: Every day | ORAL | Status: DC
  Administered 2023-10-23: 0.4 mg via ORAL
  Filled 2023-10-22: qty 1

## 2023-10-22 MED ORDER — HYDROMORPHONE HCL 1 MG/ML IJ SOLN
0.5000 mg | INTRAMUSCULAR | Status: DC | PRN
  Administered 2023-10-23: 0.5 mg via INTRAVENOUS
  Filled 2023-10-22: qty 0.5

## 2023-10-22 MED ORDER — LACTATED RINGERS IV SOLN: INTRAVENOUS | Status: AC

## 2023-10-22 MED ORDER — INSULIN GLARGINE-YFGN 100 UNIT/ML ~~LOC~~ SOLN
5.0000 [IU] | Freq: Every day | SUBCUTANEOUS | Status: DC
  Administered 2023-10-22: 5 [IU] via SUBCUTANEOUS
  Filled 2023-10-22 (×2): qty 0.05

## 2023-10-22 MED ORDER — SODIUM CHLORIDE 0.9% FLUSH
3.0000 mL | Freq: Once | INTRAVENOUS | Status: AC
  Administered 2023-10-22: 3 mL via INTRAVENOUS

## 2023-10-22 MED ORDER — SODIUM CHLORIDE 0.9% FLUSH
3.0000 mL | Freq: Two times a day (BID) | INTRAVENOUS | Status: DC
  Administered 2023-10-22 – 2023-10-23 (×3): 3 mL via INTRAVENOUS

## 2023-10-22 MED ORDER — ASPIRIN 81 MG PO CHEW
81.0000 mg | CHEWABLE_TABLET | Freq: Every day | ORAL | Status: DC
  Administered 2023-10-23: 81 mg via ORAL
  Filled 2023-10-22: qty 1

## 2023-10-22 MED ORDER — TRAZODONE HCL 50 MG PO TABS
100.0000 mg | ORAL_TABLET | Freq: Once | ORAL | Status: AC
  Administered 2023-10-23: 100 mg via ORAL
  Filled 2023-10-22: qty 2

## 2023-10-22 MED ORDER — ACETAMINOPHEN 325 MG PO TABS: 650.0000 mg | ORAL_TABLET | Freq: Four times a day (QID) | ORAL | Status: DC | PRN

## 2023-10-22 MED ORDER — METOPROLOL SUCCINATE ER 25 MG PO TB24
25.0000 mg | ORAL_TABLET | Freq: Every day | ORAL | Status: DC
  Administered 2023-10-23: 25 mg via ORAL
  Filled 2023-10-22: qty 1

## 2023-10-22 NOTE — ED Notes (Signed)
CCMD called to place Pt on Cardiac Monitoring.

## 2023-10-22 NOTE — ED Notes (Signed)
This RN made contact with Pts nephew and brother about stimulator remote for imaging. They stated they will bring it. Unk when they will bring it.

## 2023-10-22 NOTE — Assessment & Plan Note (Signed)
History of UC documented in chart, however patient is not on any current therapy no active colitis seen on CT imaging

## 2023-10-22 NOTE — Assessment & Plan Note (Signed)
No chest pain reported at this time.  - Continue home regimen

## 2023-10-22 NOTE — Assessment & Plan Note (Addendum)
Patient is presenting with approximately 12 hours of nausea and vomiting, found to have a splenic infarct on imaging.  No history of atrial fibrillation, however multiple risk factors.  In addition, echocardiogram in 2022 demonstrated an irregular rhythm during examination, however EKG is not available from that visit.  EDP discussed with IR, who recommended CTA of the chest/abdomen.  - Telemetry monitoring - Zofran as needed for nausea - Will order CTA once patient has been hydrated - Echocardiogram - Once MRI of the brain has been completed to evaluate for CVA, will consider anticoagulation - Blood cultures

## 2023-10-22 NOTE — ED Provider Notes (Signed)
[facility] Provider Note    Event Date/Time   First MD Initiated Contact with Patient 10/22/23 [phone]     (approximate)   History   Weakness and Facial Droop   HPI  Jeffery Beck is a 73 y.o. male with a history of CVA as well as diabetes presents to the ER for evaluation of 12 hours of nausea vomiting.  States that he has been having some nasal congestion been at church with several sick church members.  States that he took a nasal lavage last night and also took a MiraLAX and feels like the combination of the 2 causes upset stomach.  He denies any abdominal pain.  Was given Zofran in triage with improvement in the vomiting but still having some nausea.  He denies any cough or congestion.  No measured temperature.     Physical Exam   Triage Vital Signs: ED Triage Vitals  Encounter Vitals Group     BP 10/22/23 0711 (!) 169/94     Systolic BP Percentile --      Diastolic BP Percentile --      Pulse Rate 10/22/23 0711 99     Resp 10/22/23 0711 16     Temp 10/22/23 0711 98.2 F (36.8 C)     Temp Source 10/22/23 0711 Oral     SpO2 10/22/23 0711 100 %     Weight 10/22/23 0712 190 lb (86.2 kg)     Height 10/22/23 0712 5\' 10"  (1.778 m)     Head Circumference --      Peak Flow --      Pain Score 10/22/23 0712 7     Pain Loc --      Pain Education --      Exclude from Growth Chart --     Most recent vital signs: Vitals:   10/22/23 0833 10/22/23 1000  BP:  (!) 169/85  Pulse:  97  Resp:  13  Temp: 98.1 F (36.7 C)   SpO2:  99%     Constitutional: Alert  Eyes: Conjunctivae are normal.  Head: Atraumatic. Nose: No congestion/rhinnorhea. Mouth/Throat: Mucous membranes are moist.   Neck: Painless ROM.  Cardiovascular:   Good peripheral circulation. Respiratory: Normal respiratory effort.  No retractions.  Gastrointestinal: Soft and nontender in all four quadrants  Musculoskeletal:  no deformity Neurologic:  MAE spontaneously. No gross focal  neurologic deficits are appreciated.  Skin:  Skin is warm, dry and intact. No rash noted. Psychiatric: Mood and affect are normal. Speech and behavior are normal.    ED Results / Procedures / Treatments   Labs (all labs ordered are listed, but only abnormal results are displayed) Labs Reviewed  CBC - Abnormal; Notable for the following components:      Result Value   WBC 12.7 (*)    RBC 5.83 (*)    HCT 52.9 (*)    Platelets 472 (*)    All other components within normal limits  DIFFERENTIAL - Abnormal; Notable for the following components:   Neutro Abs 11.9 (*)    Lymphs Abs 0.5 (*)    All other components within normal limits  COMPREHENSIVE METABOLIC PANEL - Abnormal; Notable for the following components:   CO2 21 (*)    Glucose, Bld 177 (*)    BUN 31 (*)    Creatinine, Ser 1.37 (*)    Total Protein 9.0 (*)    Alkaline Phosphatase 179 (*)    Total Bilirubin 1.4 (*)  GFR, Estimated 54 (*)    Anion gap 17 (*)    All other components within normal limits  RESP PANEL BY RT-PCR (RSV, FLU A&B, COVID)  RVPGX2  PROTIME-INR  APTT  ETHANOL  TROPONIN I (HIGH SENSITIVITY)  TROPONIN I (HIGH SENSITIVITY)     EKG  ED ECG REPORT I, Willy Eddy, the attending physician, personally viewed and interpreted this ECG.   Date: 10/22/2023  EKG Time: 7:12  Rate: 110  Rhythm: sinus  Axis: left  Intervals: normal  ST&T Change: no stemi, non specific st abn    RADIOLOGY Please see ED Course for my review and interpretation.  I personally reviewed all radiographic images ordered to evaluate for the above acute complaints and reviewed radiology reports and findings.  These findings were personally discussed with the patient.  Please see medical record for radiology report.    PROCEDURES:  Critical Care performed: No  Procedures   MEDICATIONS ORDERED IN ED: Medications  metoCLOPramide (REGLAN) injection 10 mg (has no administration in time range)  sodium chloride  flush (NS) 0.9 % injection 3 mL (3 mLs Intravenous Given 10/22/23 0823)  sodium chloride 0.9 % bolus 1,000 mL (0 mLs Intravenous Stopped 10/22/23 0945)  ondansetron (ZOFRAN) injection 4 mg (4 mg Intravenous Given 10/22/23 0822)  iohexol (OMNIPAQUE) 300 MG/ML solution 100 mL (100 mLs Intravenous Contrast Given 10/22/23 1021)  sodium chloride 0.9 % bolus 500 mL (500 mLs Intravenous New Bag/Given 10/22/23 1221)     IMPRESSION / MDM / ASSESSMENT AND PLAN / ED COURSE  I reviewed the triage vital signs and the nursing notes.                              Differential diagnosis includes, but is not limited to, Dehydration, sepsis, pna, uti, hypoglycemia, cva, drug effect, withdrawal, encephalitis  Patient presenting to the ER for evaluation of symptoms as described above.  Based on symptoms, risk factors and considered above differential, this presenting complaint could reflect a potentially life-threatening illness therefore the patient will be placed on continuous pulse oximetry and telemetry for monitoring.  Laboratory evaluation will be sent to evaluate for the above complaints.      Clinical Course as of 10/22/23 1225  Mon Oct 22, 2023  9147 CT head on my review and interpretation shows no evidence of sdh, iph [PR]  1147 Discussed the case in consultation with interventional radiology.  Given presentation is recommending CTA chest abdomen pelvis but given borderline renal function already receiving contrast today recommending admission for hydration further workup management pain control prior to CTA tomorrow..  Will consult hospitalist. [PR]    Clinical Course User Index [PR] Willy Eddy, MD   Case discussed in consultation with hospitalist for admission.  FINAL CLINICAL IMPRESSION(S) / ED DIAGNOSES   Final diagnoses:  Intractable nausea and vomiting  Splenic infarct     Rx / DC Orders   ED Discharge Orders     None        Note:  This document was prepared using Dragon  voice recognition software and may include unintentional dictation errors.    Willy Eddy, MD 10/22/23 1225

## 2023-10-22 NOTE — Assessment & Plan Note (Signed)
Per chart review, history of CKD stage IIIb with last creatinine at the [facility] of 1.50.  Consistent with previous creatinine available in epic.  During creatinine 1.37 consistent with patient's baseline  -Hold nephrotoxic agents in the setting of IV contrast today - IV fluids as ordered - Recheck renal function in the a.m.

## 2023-10-22 NOTE — ED Notes (Addendum)
Pt placed on bed alarm d/t getting up w/o calling RN. Pt states he suffers from anxiety. Pt reminded Pt he needs equipment for MRI. Pt placed back on monitor.

## 2023-10-22 NOTE — ED Triage Notes (Signed)
Pt to ED via [facility] with left sided weakness and facial droop. Difficult to get clear history from patient. He states that his symptoms started yesterday around 1730. Pt reports that he has been vomiting and he feels dehydrated. Pt reports that he thinks he had a facial droop from a previous stroke. Pt was given Zofran by EMS in route.

## 2023-10-22 NOTE — ED Notes (Signed)
[place] from CT called stating that pt says that he had a fall recently and is having neck pain. C spine CT to be added. Pt has bruising around his left eye.

## 2023-10-22 NOTE — ED Triage Notes (Signed)
First nurse note: Pt presents via EMS c/o left sided weakness per EMS. EMS reports stroke screen negative.   18G L forearm. 4mg  IV Zofran given by EMS.  CBG 193 per EMS>   Hx HTN and DM.

## 2023-10-22 NOTE — Progress Notes (Signed)
Per RN Corrie Dandy, patients daughter bringing remote for stimulator at 7am.  Notified RN to call when it gets here.

## 2023-10-22 NOTE — H&P (Signed)
History and Physical    Patient: Jeffery Beck ZOX:[id number] DOB: 18-Oct-1950 DOA: 10/22/2023 DOS: the patient was seen and examined on 10/22/2023 [facility]  Patient coming from: Home  Chief Complaint:  Chief Complaint  Patient presents with   Weakness   Facial Droop   HPI: Jeffery Beck is a 73 y.o. male with medical history significant of CAD s/p CABG, bipolar disorder, Type 2 diabetes, hypertension, hyperlipidemia, CKD stage IIIb, BPH, chronic pain, ulcerative colitis, who presents to the ED due to nausea and vomiting.  Jeffery Beck states Jeffery Beck has been experiencing nausea with vomiting since 6 pm yesterday. His symptoms came on quickly and have persisted.  Jeffery Beck thought it may have been due to MiraLAX.  Jeffery Beck denies any fever, chills, abdominal pain.  Jeffery Beck notes that Jeffery Beck has also been experiencing congestion but this is chronic for his entire life.  Endorses chronic intermittent palpitations, but denies any chest pain at rest.  Jeffery Beck states Jeffery Beck has been told in the past Jeffery Beck has a facial droop, however Jeffery Beck is not sure when Jeffery Beck was first noticed and is uncertain if Jeffery Beck has had a full stroke in the past.  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  Jeffery Beck denies any new weakness at this time.  ED course: On arrival to the ED, patient was hypertensive at 154/73 with heart rate of 98.  Jeffery Beck was saturating at 97% on room air.  Jeffery Beck was afebrile at 98.2.  Initial workup notable for WBC of 12.7, platelets 472, bicarb 21, glucose 177, BUN 31, creatinine 0.37 with GFR 54.  Troponin negative x 2.  COVID-19, influenza and RSV PCR negative.  CT of the head and C-spine with no acute abnormalities.  Chest x-ray with no acute disease.  CT of the abdomen notable for developing infarct in the inferior pole of the spleen.  IR was consulted with recommendations to obtain CTA.  Patient started on IV fluids, Reglan and  Zofran.  TRH contacted for admission.  Review of Systems: As mentioned in the history of present illness. All other systems reviewed and are negative.  Past Medical History:  Diagnosis Date   Chronic back pain    Hypertension    Osteoarthritis    Past Surgical History:  Procedure Laterality Date   2 heart stents     back fusion     BACK SURGERY     trial back stimulator for chronic back pain   Social History:  reports that Jeffery Beck has quit smoking. His smoking use included cigarettes. Jeffery Beck has never used smokeless tobacco. Jeffery Beck reports that Jeffery Beck does not currently use alcohol. Jeffery Beck reports current drug use.  No Known Allergies  History reviewed. No pertinent family history.  Prior to Admission medications   Medication Sig Start Date End Date Taking? Authorizing Provider  atorvastatin (LIPITOR) 10 MG tablet Take by mouth.    [provider]  dapagliflozin propanediol (FARXIGA) 5 MG TABS tablet Take by mouth.    [provider]  Insulin Glargine (LANTUS [place]) Inject 22 Units into the skin at bedtime.    [provider]  LISINOPRIL PO Take by mouth.    [provider]  mirtazapine (REMERON) 15 MG tablet Take 1 tablet (15 mg total) by mouth at bedtime. 06/09/22   Sarina Ill, DO  OLANZapine (ZYPREXA) 15 MG tablet Take 1 tablet (15 mg  total) by mouth at bedtime. 06/09/22   Sarina Ill, DO  pantoprazole (PROTONIX) 20 MG tablet Take 1 tablet (20 mg total) by mouth daily for 14 days. 05/29/22 06/12/22  Concha Se, MD  pregabalin (LYRICA) 75 MG capsule Take 75 mg by mouth at bedtime. 05/28/19   [provider]  Semaglutide (OZEMPIC, 1 MG/DOSE, [place]) Inject 1 mg into the skin once a week.    [provider]  traZODone (DESYREL) 100 MG tablet Take 1 tablet (100 mg total) by mouth at bedtime as needed for sleep. 06/09/22   Sarina Ill, DO    Physical Exam: Vitals:   10/22/23 0800 10/22/23 0815 10/22/23 0833 10/22/23 1000   BP: (!) 154/73   (!) 169/85  Pulse: 99 94  97  Resp: 12 14  13   Temp:   98.1 F (36.7 C)   TempSrc:   Oral   SpO2: 97% 100%  99%  Weight:      Height:       Physical Exam Vitals and nursing note reviewed.  Constitutional:      General: Jeffery Beck is not in acute distress.    Appearance: Jeffery Beck is normal weight.  HENT:     Head: Normocephalic and atraumatic.     Mouth/Throat:     Mouth: Mucous membranes are moist.     Pharynx: Oropharynx is clear.  Cardiovascular:     Rate and Rhythm: Regular rhythm. Tachycardia present.     Heart sounds: No murmur heard.    No gallop.  Pulmonary:     Effort: Pulmonary effort is normal. No respiratory distress.     Breath sounds: Normal breath sounds.  Abdominal:     General: Bowel sounds are normal. There is no distension.     Palpations: Abdomen is soft.     Tenderness: There is no abdominal tenderness. There is no guarding.  Musculoskeletal:     Right lower leg: No edema.     Left lower leg: No edema.  Skin:    General: Skin is warm and dry.  Neurological:     Mental Status: Jeffery Beck is alert and oriented to person, place, and time.     Comments:  Left sided facial droop noted.  5/5 strength throughout Sensation grossly intact throughout Normal finger to nose bilaterally  Psychiatric:        Mood and Affect: Affect is tearful (recent passing of his father).        Behavior: Behavior is cooperative.    Data Reviewed: CBC with WBC of 12.7, hemoglobin of 16.6, platelets of 472 CMP with sodium of 139, potassium 4.8, bicarb 21, glucose 177, BUN 31, creatinine 1.37, anion gap 17, alkaline phosphatase 179, AST 32, ALT 18, GFR 54 Troponin negative x 2 INR 1.0 COVID-19, influenza and RSV PCR negative Alcohol level negative  EKG personally reviewed.  Sinus rhythm with a rate of 109.  Left bundle branch block.  Compared to previous EKG, no changes noted.  CT ABDOMEN PELVIS W CONTRAST Result Date: 10/22/2023 CLINICAL DATA:  Abdominal pain.  Concern  for bowel obstruction. EXAM: CT ABDOMEN AND PELVIS WITH CONTRAST TECHNIQUE: Multidetector CT imaging of the abdomen and pelvis was performed using the standard protocol following bolus administration of intravenous contrast. RADIATION DOSE REDUCTION: This exam was performed according to the departmental dose-optimization program which includes automated exposure control, adjustment of the mA and/or kV according to patient size and/or use of iterative reconstruction technique. CONTRAST:  OMNIPAQUE IOHEXOL 300 MG/ML  SOLN COMPARISON:  CT abdomen pelvis dated 05/29/2022. FINDINGS: Lower chest: Right lung base linear atelectasis/scarring. The visualized lung bases are otherwise clear. There is coronary vascular calcification and postsurgical changes of CABG. No intra-abdominal free air or free fluid. Hepatobiliary: The liver is unremarkable. No biliary dilatation. The gallbladder is distended. No calcified gallstone or pericholecystic fluid. Pancreas: Moderately atrophic pancreas. No active inflammatory changes. No dilatation of the main pancreatic duct. Spleen: Small which shaped hypoenhancing area in the inferior pole of the spleen suspicious for developing infarct. Adrenals/Urinary Tract: The adrenal glands are unremarkable. Small bilateral renal cysts. There is no hydronephrosis on either side. There is symmetric enhancement and excretion of contrast by both kidneys. The visualized ureters and urinary bladder appear unremarkable. Stomach/Bowel: There is diverticulosis of the descending and sigmoid colon. There is no bowel obstruction or active inflammation. The appendix is normal. Vascular/Lymphatic: Moderate aortoiliac atherosclerotic disease. The IVC is unremarkable. No portal venous gas. There is no adenopathy. Reproductive: Large prostate gland measuring 6.3 cm in transverse axial diameter. The seminal vesicles are symmetric. Other: Spinal stimulator with battery pack in the subcutaneous soft tissues of  the right lower back. Musculoskeletal: Degenerative changes of the spine. Lower lumbar posterior fusion. No acute osseous pathology. IMPRESSION: 1. Developing infarct in the inferior pole of the spleen. 2. Colonic diverticulosis.  No bowel obstruction. Normal appendix. 3. Enlarged prostate gland. 4.  Aortic Atherosclerosis (ICD10-I70.0). Electronically Signed   By: Elgie Collard M.D.   On: 10/22/2023 10:56   DG Chest Portable 1 View Result Date: 10/22/2023 CLINICAL DATA:  Left-sided weakness. EXAM: PORTABLE CHEST 1 VIEW COMPARISON:  04/19/2023 FINDINGS: 2 AP radiographs, both apical lordotic positioning. Numerous leads and wires project over the chest. Midline trachea. Mild cardiomegaly. Median sternotomy. Dorsal spinal stimulator. No pleural effusion or pneumothorax. No congestive failure. Clear lungs. IMPRESSION: 1. No acute cardiopulmonary disease. 2. Cardiomegaly, without congestive failure. Electronically Signed   By: Jeronimo Greaves M.D.   On: 10/22/2023 08:44   CT CERVICAL SPINE WO CONTRAST Result Date: 10/22/2023 CLINICAL DATA:  73 year old male with left side weakness and facial droop since 1730 hours. Vomiting. EXAM: CT CERVICAL SPINE WITHOUT CONTRAST TECHNIQUE: Multidetector CT imaging of the cervical spine was performed without intravenous contrast. Multiplanar CT image reconstructions were also generated. RADIATION DOSE REDUCTION: This exam was performed according to the departmental dose-optimization program which includes automated exposure control, adjustment of the mA and/or kV according to patient size and/or use of iterative reconstruction technique. COMPARISON:  Cervical spine CT 06/22/2019. FINDINGS: Alignment: Chronic straightening of cervical lordosis. Cervicothoracic junction alignment is within normal limits. Bilateral posterior element alignment is within normal limits. Skull base and vertebrae: Normal background bone mineralization. Visualized skull base is intact. No  atlanto-occipital dissociation. C1 and C2 appear intact and aligned. No acute osseous abnormality identified. Chronic benign bone island at C7. Soft tissues and spinal canal: No prevertebral fluid or swelling. No visible canal hematoma. Stable noncontrast neck soft tissues, calcified carotid atherosclerosis. Disc levels: Widespread advanced cervical spine degeneration superimposed on degenerative appearing C2-C3 ankylosis. Bulky disc and endplate degeneration C3-C4 through C6-C7 with multilevel spinal stenosis, stable. Upper chest: Lung apices are clear. Visible upper thoracic levels appear intact. IMPRESSION: 1. No acute traumatic injury identified in the cervical spine. 2. 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 Multilevel spinal stenosis, stable by CT since 2020. Electronically Signed   By: Odessa Fleming M.D.   On: 10/22/2023 07:50   CT HEAD WO CONTRAST ( ) Result Date: 10/22/2023  CLINICAL DATA:  73 year old male with left side weakness and facial droop since 1730 hours. Vomiting. EXAM: CT HEAD WITHOUT CONTRAST TECHNIQUE: Contiguous axial images were obtained from the base of the skull through the vertex without intravenous contrast. RADIATION DOSE REDUCTION: This exam was performed according to the departmental dose-optimization program which includes automated exposure control, adjustment of the mA and/or kV according to patient size and/or use of iterative reconstruction technique. COMPARISON:  Brain MRI 01/22/2020.  Recent head CT 10/16/2023. FINDINGS: Brain: Cerebral volume is stable and normal for age. No midline shift, ventriculomegaly, mass effect, evidence of mass lesion, intracranial hemorrhage or evidence of cortically based acute infarction. Stable and largely normal for age gray-white differentiation. Mild for age white matter changes are stable. ASPECTS 10. Vascular: Calcified atherosclerosis at the skull base. No suspicious intracranial vascular hyperdensity. Skull: Stable  and intact. Sinuses/Orbits: Visualized paranasal sinuses and mastoids are stable and well aerated. Other: No gaze deviation. No acute orbit or scalp soft tissue finding. IMPRESSION: No acute intracranial abnormality. Stable mild for age cerebral white matter changes. Electronically Signed   By: Odessa Fleming M.D.   On: 10/22/2023 07:47   Results are pending, will review when available.  Assessment and Plan:  * Splenic infarct Patient is presenting with approximately 12 hours of nausea and vomiting, found to have a splenic infarct on imaging.  No history of atrial fibrillation, however multiple risk factors.  In addition, echocardiogram in 2022 demonstrated an irregular rhythm during examination, however EKG is not available from that visit.  EDP discussed with IR, who recommended CTA of the chest/abdomen.  - Telemetry monitoring - Zofran as needed for nausea - Will order CTA once patient has been hydrated - Echocardiogram - Once MRI of the brain has been completed to evaluate for CVA, will consider anticoagulation - Blood cultures  Facial droop Unclear timeline of patient's facial droop, Jeffery Beck reports it is chronic however I cannot find any documentation in the chart, although records from the [place] are limited.  - MRI brain to evaluate for acute infarct - Telemetry monitoring  Mood disorder (HCC) - Continue home regimen  Type 2 diabetes mellitus without complications (HCC) - Hold home regimen - A1c - SSI, moderate - Semglee 5 units at bedtime  Chronic kidney disease, stage 3b (HCC) Per chart review, history of CKD stage IIIb with last creatinine at the [facility] of 1.50.  Consistent with previous creatinine available in epic.  During creatinine 1.37 consistent with patient's baseline  -Hold nephrotoxic agents in the setting of IV contrast today - IV fluids as ordered - Recheck renal function in the a.m.  CAD (coronary artery disease) No chest pain reported at this time.  - Continue home  regimen  Ulcerative colitis (HCC) History of UC documented in chart, however patient is not on any current therapy no active colitis seen on CT imaging  Advance Care Planning:   Code Status: Full Code   Consults: None  Family Communication: No family at bedside  Severity of Illness: The appropriate patient status for this patient is OBSERVATION. Observation status is judged to be reasonable and necessary in order to provide the required intensity of service to ensure the patient's safety. The patient's presenting symptoms, physical exam findings, and initial radiographic and laboratory data in the context of their medical condition is felt to place them at decreased risk for further clinical deterioration. Furthermore, it is anticipated that the patient will be medically stable for discharge from the hospital within 2 midnights  of admission.   Author: Verdene Lennert, MD 10/22/2023 1:51 PM  For on call review [email or web address].

## 2023-10-22 NOTE — Assessment & Plan Note (Signed)
-  Continue home regimen

## 2023-10-22 NOTE — Assessment & Plan Note (Addendum)
Unclear timeline of patient's facial droop, he reports it is chronic however I cannot find any documentation in the chart, although records from the [place] are limited.  - MRI brain to evaluate for acute infarct - Telemetry monitoring

## 2023-10-22 NOTE — ED Notes (Addendum)
Pt has an 18G IV that will not pull blood back, but will flush. This tech attempted to straight stick pt for blood, unsuccessful. Will call lab to draw blood.   Lab states they are short staffed but will "try to get someone to our area" when they can

## 2023-10-22 NOTE — Assessment & Plan Note (Addendum)
-   Hold home regimen - A1c - SSI, moderate - Semglee 5 units at bedtime

## 2023-10-23 ENCOUNTER — Observation Stay (HOSPITAL_BASED_OUTPATIENT_CLINIC_OR_DEPARTMENT_OTHER)
Admit: 2023-10-23 | Discharge: 2023-10-23 | Disposition: A | Discharge status: Home / Self Care | Payer: No Typology Code available for payment source | Attending: Internal Medicine | Admitting: Internal Medicine

## 2023-10-23 DIAGNOSIS — I5030 Unspecified diastolic (congestive) heart failure: Secondary | ICD-10-CM | POA: Diagnosis not present

## 2023-10-23 DIAGNOSIS — D735 Infarction of spleen: Secondary | ICD-10-CM | POA: Diagnosis not present

## 2023-10-23 LAB — CBC WITH DIFFERENTIAL/PLATELET
Abs Immature Granulocytes: 0.05 10*3/uL (ref 0.00–0.07)
Basophils Absolute: 0 10*3/uL (ref 0.0–0.1)
Basophils Relative: 0 %
Eosinophils Absolute: 0.1 10*3/uL (ref 0.0–0.5)
Eosinophils Relative: 1 %
HCT: 42.2 % (ref 39.0–52.0)
Hemoglobin: 13.5 g/dL (ref 13.0–17.0)
Immature Granulocytes: 1 %
Lymphocytes Relative: 14 %
Lymphs Abs: 1.3 10*3/uL (ref 0.7–4.0)
MCH: 29 pg (ref 26.0–34.0)
MCHC: 32 g/dL (ref 30.0–36.0)
MCV: 90.8 fL (ref 80.0–100.0)
Monocytes Absolute: 1 10*3/uL (ref 0.1–1.0)
Monocytes Relative: 10 %
Neutro Abs: 7.2 10*3/uL (ref 1.7–7.7)
Neutrophils Relative %: 74 %
Platelets: 399 10*3/uL (ref 150–400)
RBC: 4.65 MIL/uL (ref 4.22–5.81)
RDW: 15.6 % — ABNORMAL HIGH (ref 11.5–15.5)
WBC: 9.6 10*3/uL (ref 4.0–10.5)
nRBC: 0 % (ref 0.0–0.2)

## 2023-10-23 LAB — BASIC METABOLIC PANEL
Anion gap: 12 (ref 5–15)
BUN: 29 mg/dL — ABNORMAL HIGH (ref 8–23)
CO2: 20 mmol/L — ABNORMAL LOW (ref 22–32)
Calcium: 8.9 mg/dL (ref 8.9–10.3)
Chloride: 107 mmol/L (ref 98–111)
Creatinine, Ser: 1.4 mg/dL — ABNORMAL HIGH (ref 0.61–1.24)
GFR, Estimated: 53 mL/min — ABNORMAL LOW (ref >60)
Glucose, Bld: 128 mg/dL — ABNORMAL HIGH (ref 70–99)
Potassium: 4.2 mmol/L (ref 3.5–5.1)
Sodium: 139 mmol/L (ref 135–145)

## 2023-10-23 LAB — GLUCOSE, CAPILLARY
Glucose-Capillary: 113 mg/dL — ABNORMAL HIGH (ref 70–99)
Glucose-Capillary: 137 mg/dL — ABNORMAL HIGH (ref 70–99)
Glucose-Capillary: 140 mg/dL — ABNORMAL HIGH (ref 70–99)

## 2023-10-23 LAB — ECHOCARDIOGRAM COMPLETE
AR max vel: 2.71 cm2
AV Area VTI: 2.9 cm2
AV Area mean vel: 2.79 cm2
AV Mean grad: 4 mm[Hg]
AV Peak grad: 7.5 mm[Hg]
Ao pk vel: 1.37 m/s
Area-P 1/2: 4.08 cm2
Height: 70 in
MV VTI: 2.53 cm2
S' Lateral: 2.9 cm
Weight: 3040 [oz_av]

## 2023-10-23 MED ORDER — TRAZODONE HCL 50 MG PO TABS: 100.0000 mg | ORAL_TABLET | Freq: Every evening | ORAL | Status: DC | PRN

## 2023-10-23 MED ORDER — ONDANSETRON HCL 4 MG PO TABS: 4.0000 mg | ORAL_TABLET | Freq: Four times a day (QID) | ORAL | 0 refills | Status: AC | PRN

## 2023-10-23 NOTE — Progress Notes (Signed)
*  PRELIMINARY RESULTS* Echocardiogram 2D Echocardiogram has been performed.  Cristela Blue 10/23/2023, 8:50 AM

## 2023-10-23 NOTE — Hospital Course (Addendum)
Hospital course / significant events:   HPI: Jeffery Beck is a 73 y.o. male with medical history significant of CAD s/p CABG, bipolar disorder, Type 2 diabetes, hypertension, hyperlipidemia, CKD stage IIIb, BPH, chronic pain, ulcerative colitis, who presents to the ED due to nausea and vomiting x1 day, sudden onset.   01/27: in ED, CT of the abdomen notable for developing infarct in the inferior pole of the spleen. IR was consulted. CTA done.  01/28: CTA no significant occlusive disease in supply to spleen, (+)atherosclerosis possibly embolized, spoke w/ IR (Dr Miles Costain) and confirmed no intervention needed from them and that CT head would reliably r/o bleeding which would preclude AC, but benefit of AC probably minimal, the infarct is small. In discussion w/ patient, he never really had localized LUQ pain so suspect the infarct may have been incidental and not relevant to his symptoms. He rpeorts at this time all symptoms have resolved and he is tolerating diet, no other concerns and feels eager for discharge is possible. 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 Associated nausea and vomiting No history of atrial fibrillation, however multiple risk factors, Echo in 2022 w/ irregular rhythm during examination, EKG is not available from that visit.   EDP discussed with IR CTA of the chest/abdomen --> see below, no clinically significant abdnormliaty which would affect spleen  Telemetry monitoring Zofran as needed for nausea Echocardiogram Once MRI of the brain has been completed to evaluate for CVA, will consider anticoagulation Blood cultures     Facial droop Unclear timeline of patient's facial droop, he reports it is chronic however I cannot find any documentation in the chart, although records from the  [place] are limited. MRI brain to evaluate for acute infarct --> pt states  Telemetry monitoring   Mood disorder  Continue home regimen   Type 2 diabetes mellitus without complications  Hold home regimen A1c x       SSI, moderate Semglee 5 units at bedtime   Chronic kidney disease, stage 3b Per chart review, history of CKD stage IIIb with last creatinine at the [place] of 1.50.  Consistent with previous creatinine available in epic.  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 Multilevel spinal stenosis, stable by CT since 2020. Pain control   Right solid pulmonary nodule within the upper lobe measuring 2 Mm - incidental finding not clinically significant   If low risk for malignancy, no routine follow-up imaging is recommended. If patient is high risk for malignancy, a non-contrast Chest CT at 12 months is optional. If performed and the nodule is stable at 12 months, no further follow-up is recommended.  Sigmoid colonic diverticulosis  without diverticulitis - not clinically significant  Hx Ulcerative colitis  History of UC documented in chart, however patient is not on any current therapy no active colitis seen on CT imaging No intervention   Enlarged prostate gland. Mild diffuse bladder wall thickening may be related to cystitis or chronic bladder outlet obstruction  No intervention at this time   Overweight based on BMI: Body mass index is 27.26 kg/m.  Underweight - under 18  overweight - 25 to 29 obese - 30 or more Class 1 obesity: BMI of 30.0 to 34 Class 2 obesity: BMI of 35.0 to 39 Class 3 obesity: BMI of 40.0 to 49 Super Morbid Obesity: BMI 50-59 Super-super Morbid Obesity: BMI 60+ Significantly low or high BMI is associated with higher medical risk.  Weight management advised as adjunct to other disease management and risk reduction treatments

## 2023-10-23 NOTE — Discharge Summary (Signed)
Physician Discharge Summary   Patient: Jeffery Beck MRN: [id number]  DOB: [birth year]   Admit:     Date of Admission: 10/22/2023 Admitted from: home   Discharge: Date of discharge: 10/23/23 Disposition: Home Condition at discharge: good  CODE STATUS: FULL CODE     Discharge Physician: Sunnie Nielsen, DO [facility]     PCP: [facility]  Recommendations for Outpatient Follow-up:  Follow up with PCP [facility] in 1-2 weeks See assessment/plan   Discharge Instructions     Diet - low sodium heart healthy   Complete by: As directed    Increase activity slowly   Complete by: As directed          Discharge Diagnoses: Principal Problem:   Splenic infarct Active Problems:   Facial droop   Ulcerative colitis (HCC)   CAD (coronary artery disease)   Chronic kidney disease, stage 3b (HCC)   Type 2 diabetes mellitus without complications (HCC)   Mood disorder [facility])       Hospital course / significant events:   HPI: Jeffery Beck is a 73 y.o. male with medical history significant of CAD s/p CABG, bipolar disorder, Type 2 diabetes, hypertension, hyperlipidemia, CKD stage IIIb, BPH, chronic pain, ulcerative colitis, who presents to the ED due to nausea and vomiting x1 day, sudden onset.   01/27: in ED, CT of the abdomen notable for developing infarct in the inferior pole of the spleen. IR was consulted. CTA done.  01/28: CTA no significant occlusive disease in supply to spleen, (+)atherosclerosis possibly embolized, spoke w/ IR (Dr Miles Costain) and confirmed no intervention needed from them and that CT head would reliably r/o bleeding which would preclude AC, but benefit of AC probably minimal, the infarct is small. In discussion w/ patient, he never really had localized LUQ pain so suspect the infarct may have been incidental and not relevant to his symptoms. He rpeorts at this time all symptoms have resolved and he is tolerating diet,  no other concerns and feels eager for discharge is possible. Echo no concerns, pt can go home today   Consultants:  Informal d/w IR today see hospital course   Procedures/Surgeries: none      ASSESSMENT & PLAN:   Nausea and vomiting Likely viral gastroenteritis Symptoms resolved Supportive care as needed   Splenic infarct, likely incidental  Developing infarct in the inferior pole of the spleen. Associated nausea and vomiting but never had LUQ pain  No history of atrial fibrillation, however multiple risk factors, Echo in 2022 w/ irregular rhythm during examination, EKG is not available from that visit.   CTA of the chest/abdomen --> see below, no clinically significant abdnormliaty which would affect spleen  Echocardiogram - ok  Blood cultures - expect these will be negative   Facial droop chronic  Unclear timeline of patient's facial droop, he reports it is chroni MRI brain to evaluate for acute infarct --> pt states hed rather avoid MRI if possible, since MRI results will not change management or offer other meaningful insight this was d/c    Mood disorder  Continue home regimen   Type 2 diabetes mellitus without complications  resumed home regimen   Chronic kidney disease, stage 3b Per chart review, history of CKD stage IIIb with last creatinine at the [place] of 1.50.  Consistent with previous creatinine available in epic.  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 Multilevel spinal stenosis, stable by CT since 2020. Pain control   Right solid pulmonary nodule within the upper lobe measuring 2 Mm - incidental finding not clinically significant   If low risk for malignancy, no routine follow-up imaging is recommended. If patient is high risk for malignancy, a non-contrast Chest CT at 12 months is optional. If performed and the nodule is stable at 12 months, no further follow-up is recommended.  Sigmoid colonic diverticulosis without diverticulitis - not clinically significant  Hx Ulcerative colitis  History of UC documented in chart, however patient is not on any current therapy no active colitis seen on CT imaging No intervention   Enlarged prostate gland. Mild diffuse bladder wall thickening may be related to cystitis or chronic bladder outlet obstruction  No intervention at this time   Overweight based on BMI: Body mass index is 27.26 kg/m.  Underweight - under 18  overweight - 25 to 29 obese - 30 or more Class 1 obesity: BMI of 30.0 to 34 Class 2 obesity: BMI of 35.0 to 39 Class 3 obesity: BMI of 40.0 to 49 Super Morbid Obesity: BMI 50-59 Super-super Morbid Obesity: BMI 60+ Significantly low or high BMI is associated with higher medical risk.  Weight management advised as adjunct to other disease management and risk reduction treatments             Discharge Instructions  Allergies as of 10/23/2023   No Known Allergies       Medication List     STOP taking these medications    dapagliflozin propanediol 5 MG Tabs tablet Commonly known as: FARXIGA   LANTUS [place]   OLANZapine 15 MG tablet Commonly known as: ZYPREXA   OZEMPIC (1 MG/DOSE) [place]   pantoprazole 20 MG tablet Commonly known as: Protonix       TAKE these medications    aspirin 81 MG chewable tablet Chew 81 mg by mouth daily.  atorvastatin 80 MG tablet Commonly known as: LIPITOR Take 80 mg by mouth daily. What changed: Another medication with the same name was removed. Continue taking this medication, and follow the directions you see here.   empagliflozin 25 MG Tabs tablet Commonly known as: JARDIANCE Take 25 mg by mouth daily.   fluticasone 50 MCG/ACT nasal spray Commonly known as: FLONASE Place 2 sprays into both nostrils daily.   lisinopril 40 MG tablet Commonly known as: ZESTRIL Take 20 mg by mouth daily. What changed: Another medication with the same name was removed. Continue taking this medication, and follow the directions you see here.   metoprolol succinate 50 MG 24 hr tablet Commonly known as: TOPROL-XL Take 25 mg by mouth daily.   mirtazapine 15 MG tablet Commonly known as: REMERON Take 1 tablet (15 mg total) by mouth at bedtime.   nortriptyline 25 MG capsule Commonly known as: PAMELOR Take 25 mg by mouth at bedtime.   ondansetron 4 MG tablet Commonly known as: ZOFRAN Take 1 tablet (4 mg total) by mouth every 6 (six) hours as needed for nausea.   pregabalin 75 MG capsule Commonly known as: LYRICA Take 1 capsule by mouth 2 (two) times daily. What changed: Another medication with the same name was removed. Continue taking this medication, and follow the directions you see here.   tamsulosin 0.4 MG Caps capsule Commonly known as: FLOMAX Take 0.4 mg by mouth daily.   traZODone 50 MG tablet Commonly known as: DESYREL Take by mouth at bedtime. Take 50mg  to 100mg  by mouth at bedtime as needed for  insomnia What changed: Another medication with the same name was removed. Continue taking this medication, and follow the directions you see here.          No Known Allergies   Subjective: pt rpeorts feeling great this mornign, GI symptoms have resolved, eager for dc home    Discharge Exam: BP 128/71 (BP Location: Right Arm)   Pulse 77   Temp 98.1 F (36.7 C)   Resp 18   Ht 5\' 10"  (1.778 m)   Wt 86.2 kg   SpO2 98%   BMI 27.26 kg/m  General: Pt is alert, awake, not in acute distress Cardiovascular: RRR, S1/S2 +, no rubs, no gallops Respiratory: CTA bilaterally, no wheezing, no rhonchi Abdominal: Soft, NT, ND, bowel sounds + Extremities: no edema, no cyanosis     The results of significant diagnostics from this hospitalization (including imaging, microbiology, ancillary and laboratory) are listed below for reference.     Microbiology: Recent Results (from the past 240 hours)  Resp panel by RT-PCR (RSV, Flu A&B, Covid) Anterior Nasal Swab     Status: None   Collection Time: 10/22/23  8:01 AM   Specimen: Anterior Nasal Swab  Result Value Ref Range Status   SARS Coronavirus 2 by RT PCR NEGATIVE NEGATIVE Final    Comment: (NOTE) SARS-CoV-2 target nucleic acids are NOT DETECTED.  The SARS-CoV-2 RNA is generally detectable in upper respiratory specimens during the acute phase of infection. The lowest concentration of SARS-CoV-2 viral copies this assay can detect is 138 copies/mL. A negative result does not preclude SARS-Cov-2 infection and should not be used as the sole basis for treatment or other patient management decisions. A negative result may occur with  improper specimen collection/handling, submission of specimen other than nasopharyngeal swab, presence of viral mutation(s) within the areas targeted by this assay, and inadequate number of viral copies(<138 copies/mL). A negative result must be combined  with clinical observations, patient history, and  epidemiological information. The expected result is Negative.  Fact Sheet for Patients:  BloggerCourse.com  Fact Sheet for Healthcare Providers:  SeriousBroker.it  This test is no t yet approved or cleared by the [place] FDA and  has been authorized for detection and/or diagnosis of SARS-CoV-2 by FDA under an Emergency Use Authorization (EUA). This EUA will remain  in effect (meaning this test can be used) for the duration of the COVID-19 declaration under Section 564(b)(1) of the Act, 21 U.S.C.section 360bbb-3(b)(1), unless the authorization is terminated  or revoked sooner.       Influenza A by PCR NEGATIVE NEGATIVE Final   Influenza B by PCR NEGATIVE NEGATIVE Final    Comment: (NOTE) The Xpert Xpress SARS-CoV-2/FLU/RSV plus assay is intended as an aid in the diagnosis of influenza from Nasopharyngeal swab specimens and should not be used as a sole basis for treatment. Nasal washings and aspirates are unacceptable for Xpert Xpress SARS-CoV-2/FLU/RSV testing.  Fact Sheet for Patients: BloggerCourse.com  Fact Sheet for Healthcare Providers: SeriousBroker.it  This test is not yet approved or cleared by the [place] FDA and has been authorized for detection and/or diagnosis of SARS-CoV-2 by FDA under an Emergency Use Authorization (EUA). This EUA will remain in effect (meaning this test can be used) for the duration of the COVID-19 declaration under Section 564(b)(1) of the Act, 21 U.S.C. section 360bbb-3(b)(1), unless the authorization is terminated or revoked.     Resp Syncytial Virus by PCR NEGATIVE NEGATIVE Final    Comment: (NOTE) Fact Sheet for Patients: BloggerCourse.com  Fact Sheet for Healthcare Providers: SeriousBroker.it  This test is not yet approved or cleared by the [place] FDA and has been  authorized for detection and/or diagnosis of SARS-CoV-2 by FDA under an Emergency Use Authorization (EUA). This EUA will remain in effect (meaning this test can be used) for the duration of the COVID-19 declaration under Section 564(b)(1) of the Act, 21 U.S.C. section 360bbb-3(b)(1), unless the authorization is terminated or revoked.  Performed at [facility], [address] [place], [place] [address]      Labs: BNP (last 3 results) No results for input(s): "BNP" in the last 8760 hours. Basic Metabolic Panel: Recent Labs  Lab 10/22/23 0801 10/22/23 1842 10/23/23 0432  NA 139 140 139  K 4.8 5.1 4.2  CL 101 105 107  CO2 21* 18* 20*  GLUCOSE 177* 151* 128*  BUN 31* 29* 29*  CREATININE 1.37* 1.41* 1.40*  CALCIUM 9.8 9.3 8.9   Liver Function Tests: Recent Labs  Lab 10/22/23 0801  AST 32  ALT 18  ALKPHOS 179*  BILITOT 1.4*  PROT 9.0*  ALBUMIN 4.4   No results for input(s): "LIPASE", "AMYLASE" in the last 168 hours. No results for input(s): "AMMONIA" in the last 168 hours. CBC: Recent Labs  Lab 10/22/23 0801 10/23/23 0432  WBC 12.7* 9.6  NEUTROABS 11.9* 7.2  HGB 16.6 13.5  HCT 52.9* 42.2  MCV 90.7 90.8  PLT 472* 399   Cardiac Enzymes: No results for input(s): "CKTOTAL", "CKMB", "CKMBINDEX", "TROPONINI" in the last 168 hours. BNP: Invalid input(s): "POCBNP" CBG: Recent Labs  Lab 10/22/23 1719 10/22/23 2121 10/23/23 0944 10/23/23 1139 10/23/23 1600  GLUCAP 120* 151* 113* 137* 140*   D-Dimer No results for input(s): "DDIMER" in the last 72 hours. Hgb A1c Recent Labs    10/22/23 0801  HGBA1C 8.6*   Lipid Profile No results for input(s): "CHOL", "HDL", "LDLCALC", "TRIG", "  CHOLHDL", "LDLDIRECT" in the last 72 hours. Thyroid function studies No results for input(s): "TSH", "T4TOTAL", "T3FREE", "THYROIDAB" in the last 72 hours.  Invalid input(s): "FREET3" Anemia work up No results for input(s): "VITAMINB12", "FOLATE", "FERRITIN",  "TIBC", "IRON", "RETICCTPCT" in the last 72 hours. Urinalysis    Component Value Date/Time   COLORURINE YELLOW (A) 05/29/2022 1530   APPEARANCEUR CLEAR (A) 05/29/2022 1530   APPEARANCEUR Clear 02/13/2013 2218   LABSPEC 1.022 05/29/2022 1530   LABSPEC 1.012 02/13/2013 2218   PHURINE 6.0 05/29/2022 1530   GLUCOSEU >=500 (A) 05/29/2022 1530   GLUCOSEU 150 mg/dL 54/05/8118 1478   HGBUR NEGATIVE 05/29/2022 1530   BILIRUBINUR NEGATIVE 05/29/2022 1530   BILIRUBINUR Negative 02/13/2013 2218   KETONESUR 20 (A) 05/29/2022 1530   PROTEINUR NEGATIVE 05/29/2022 1530   NITRITE NEGATIVE 05/29/2022 1530   LEUKOCYTESUR NEGATIVE 05/29/2022 1530   LEUKOCYTESUR Negative 02/13/2013 2218   Sepsis Labs Recent Labs  Lab 10/22/23 0801 10/23/23 0432  WBC 12.7* 9.6   Microbiology Recent Results (from the past 240 hours)  Resp panel by RT-PCR (RSV, Flu A&B, Covid) Anterior Nasal Swab     Status: None   Collection Time: 10/22/23  8:01 AM   Specimen: Anterior Nasal Swab  Result Value Ref Range Status   SARS Coronavirus 2 by RT PCR NEGATIVE NEGATIVE Final    Comment: (NOTE) SARS-CoV-2 target nucleic acids are NOT DETECTED.  The SARS-CoV-2 RNA is generally detectable in upper respiratory specimens during the acute phase of infection. The lowest concentration of SARS-CoV-2 viral copies this assay can detect is 138 copies/mL. A negative result does not preclude SARS-Cov-2 infection and should not be used as the sole basis for treatment or other patient management decisions. A negative result may occur with  improper specimen collection/handling, submission of specimen other than nasopharyngeal swab, presence of viral mutation(s) within the areas targeted by this assay, and inadequate number of viral copies(<138 copies/mL). A negative result must be combined with clinical observations, patient history, and epidemiological information. The expected result is Negative.  Fact Sheet for Patients:   BloggerCourse.com  Fact Sheet for Healthcare Providers:  SeriousBroker.it  This test is no t yet approved or cleared by the [place] FDA and  has been authorized for detection and/or diagnosis of SARS-CoV-2 by FDA under an Emergency Use Authorization (EUA). This EUA will remain  in effect (meaning this test can be used) for the duration of the COVID-19 declaration under Section 564(b)(1) of the Act, 21 U.S.C.section 360bbb-3(b)(1), unless the authorization is terminated  or revoked sooner.       Influenza A by PCR NEGATIVE NEGATIVE Final   Influenza B by PCR NEGATIVE NEGATIVE Final    Comment: (NOTE) The Xpert Xpress SARS-CoV-2/FLU/RSV plus assay is intended as an aid in the diagnosis of influenza from Nasopharyngeal swab specimens and should not be used as a sole basis for treatment. Nasal washings and aspirates are unacceptable for Xpert Xpress SARS-CoV-2/FLU/RSV testing.  Fact Sheet for Patients: BloggerCourse.com  Fact Sheet for Healthcare Providers: SeriousBroker.it  This test is not yet approved or cleared by the [place] FDA and has been authorized for detection and/or diagnosis of SARS-CoV-2 by FDA under an Emergency Use Authorization (EUA). This EUA will remain in effect (meaning this test can be used) for the duration of the COVID-19 declaration under Section 564(b)(1) of the Act, 21 U.S.C. section 360bbb-3(b)(1), unless the authorization is terminated or revoked.     Resp Syncytial Virus by PCR NEGATIVE NEGATIVE Final  Comment: (NOTE) Fact Sheet for Patients: BloggerCourse.com  Fact Sheet for Healthcare Providers: SeriousBroker.it  This test is not yet approved or cleared by the [place] FDA and has been authorized for detection and/or diagnosis of SARS-CoV-2 by FDA under an Emergency Use  Authorization (EUA). This EUA will remain in effect (meaning this test can be used) for the duration of the COVID-19 declaration under Section 564(b)(1) of the Act, 21 U.S.C. section 360bbb-3(b)(1), unless the authorization is terminated or revoked.  Performed at [facility], [address] [place], [place] [address]    Imaging ECHOCARDIOGRAM COMPLETE Result Date: 10/23/2023    ECHOCARDIOGRAM REPORT   Patient Name:   CHRYSTOPHER STANGL Date of Exam: 10/23/2023 Medical Rec #:  [id number]      Height:       70.0 in Accession #:    [id number]     Weight:       190.0 lb Date of Birth:  1951/07/28      BSA:          2.042 m Patient Age:    72 years       BP:           144/77 mmHg Patient Gender: M              HR:           84 bpm. Exam Location:  [facility] Procedure: 2D Echo, Cardiac Doppler and Color Doppler Indications:     Splenic infarct  History:         Patient has no prior history of Echocardiogram examinations.                  Risk Factors:Hypertension.  Sonographer:     Cristela Blue Referring Phys:  [id number] Verdene Lennert Diagnosing Phys: Yvonne Kendall MD IMPRESSIONS  1. Left ventricular ejection fraction, by estimation, is 55 to 60%. The left ventricle has normal function. The left ventricle has no regional wall motion abnormalities. There is mild left ventricular hypertrophy. Left ventricular diastolic parameters are consistent with Grade I diastolic dysfunction (impaired relaxation).  2. Right ventricular systolic function is normal. The right ventricular size is normal. Tricuspid regurgitation signal is inadequate for assessing PA pressure.  3. The mitral valve is normal in structure. Trivial mitral valve regurgitation. No evidence of mitral stenosis.  4. The aortic valve has an indeterminant number of cusps. There is mild calcification of the aortic valve. There is mild thickening of the aortic valve. Aortic valve regurgitation is not visualized. Aortic valve sclerosis/calcification is  present, without any evidence of aortic stenosis. FINDINGS  Left Ventricle: Left ventricular ejection fraction, by estimation, is 55 to 60%. The left ventricle has normal function. The left ventricle has no regional wall motion abnormalities. The left ventricular internal cavity size was normal in size. There is  mild left ventricular hypertrophy. Left ventricular diastolic parameters are consistent with Grade I diastolic dysfunction (impaired relaxation). Right Ventricle: The right ventricular size is normal. No increase in right ventricular wall thickness. Right ventricular systolic function is normal. Tricuspid regurgitation signal is inadequate for assessing PA pressure. Left Atrium: Left atrial size was normal in size. Right Atrium: Right atrial size was normal in size. Pericardium: There is no evidence of pericardial effusion. Mitral Valve: The mitral valve is normal in structure. Trivial mitral valve regurgitation. No evidence of mitral valve stenosis. MV peak gradient, 5.3 mmHg. The mean mitral valve gradient is 3.0 mmHg. Tricuspid Valve: The tricuspid valve is normal  in structure. Tricuspid valve regurgitation is trivial. Aortic Valve: The aortic valve has an indeterminant number of cusps. There is mild calcification of the aortic valve. There is mild thickening of the aortic valve. Aortic valve regurgitation is not visualized. Aortic valve sclerosis/calcification is present, without any evidence of aortic stenosis. Aortic valve mean gradient measures 4.0 mmHg. Aortic valve peak gradient measures 7.5 mmHg. Aortic valve area, by VTI measures 2.90 cm. Pulmonic Valve: The pulmonic valve was not well visualized. Pulmonic valve regurgitation is trivial. No evidence of pulmonic stenosis. Aorta: The aortic root is normal in size and structure. Pulmonary Artery: The pulmonary artery is not well seen. Venous: The inferior vena cava was not well visualized. IAS/Shunts: No atrial level shunt detected by color flow  Doppler.  LEFT VENTRICLE PLAX 2D LVIDd:         4.40 cm   Diastology LVIDs:         2.90 cm   LV e' medial:    5.00 cm/s LV PW:         1.30 cm   LV E/e' medial:  14.7 LV IVS:        1.27 cm   LV e' lateral:   7.83 cm/s LVOT diam:     2.00 cm   LV E/e' lateral: 9.4 LV SV:         76 LV SV Index:   37 LVOT Area:     3.14 cm  RIGHT VENTRICLE RV Basal diam:  3.70 cm RV Mid diam:    3.20 cm LEFT ATRIUM             Index        RIGHT ATRIUM           Index LA diam:        3.20 cm 1.57 cm/m   RA Area:     14.80 cm LA Vol (A2C):   45.9 ml 22.47 ml/m  RA Volume:   32.00 ml  15.67 ml/m LA Vol (A4C):   53.0 ml 25.95 ml/m LA Biplane Vol: 51.1 ml 25.02 ml/m  AORTIC VALVE AV Area (Vmax):    2.71 cm AV Area (Vmean):   2.79 cm AV Area (VTI):     2.90 cm AV Vmax:           137.00 cm/s AV Vmean:          90.300 cm/s AV VTI:            0.262 m AV Peak Grad:      7.5 mmHg AV Mean Grad:      4.0 mmHg LVOT Vmax:         118.00 cm/s LVOT Vmean:        80.300 cm/s LVOT VTI:          0.242 m LVOT/AV VTI ratio: 0.92  AORTA Ao Root diam: 3.00 cm MITRAL VALVE MV Area (PHT): 4.08 cm     SHUNTS MV Area VTI:   2.53 cm     Systemic VTI:  0.24 m MV Peak grad:  5.3 mmHg     Systemic Diam: 2.00 cm MV Mean grad:  3.0 mmHg MV Vmax:       1.15 m/s MV Vmean:      85.6 cm/s MV Decel Time: 186 msec MV E velocity: 73.40 cm/s MV A velocity: 107.00 cm/s MV E/A ratio:  0.69 Cristal Deer End MD Electronically signed by Yvonne Kendall MD Signature Date/Time: 10/23/2023/2:52:57 PM    Final  Time coordinating discharge: over 30 minutes  SIGNED:  Sunnie Nielsen DO [facility]

## 2023-10-23 NOTE — Plan of Care (Addendum)
Pt was admitted late last night.  Upon arrival to unit, he was still feeling nauseous and was in pain.  Pt was only interested in going to sleep.  Will try to complete admission profile when he is feeling better.  Daughter is bringing remote for back stimulator

## 2023-11-23 ENCOUNTER — Emergency Department: Payer: No Typology Code available for payment source

## 2023-11-23 ENCOUNTER — Other Ambulatory Visit: Payer: Self-pay

## 2023-11-23 ENCOUNTER — Emergency Department
Admission: EM | Admit: 2023-11-23 | Discharge: 2023-11-23 | Disposition: A | Discharge status: Home / Self Care | Payer: No Typology Code available for payment source | Attending: Emergency Medicine | Admitting: Emergency Medicine

## 2023-11-23 DIAGNOSIS — R112 Nausea with vomiting, unspecified: Secondary | ICD-10-CM | POA: Insufficient documentation

## 2023-11-23 DIAGNOSIS — I2510 Atherosclerotic heart disease of native coronary artery without angina pectoris: Secondary | ICD-10-CM | POA: Diagnosis not present

## 2023-11-23 DIAGNOSIS — Z951 Presence of aortocoronary bypass graft: Secondary | ICD-10-CM | POA: Insufficient documentation

## 2023-11-23 DIAGNOSIS — E1122 Type 2 diabetes mellitus with diabetic chronic kidney disease: Secondary | ICD-10-CM | POA: Insufficient documentation

## 2023-11-23 DIAGNOSIS — I129 Hypertensive chronic kidney disease with stage 1 through stage 4 chronic kidney disease, or unspecified chronic kidney disease: Secondary | ICD-10-CM | POA: Diagnosis not present

## 2023-11-23 DIAGNOSIS — N183 Chronic kidney disease, stage 3 unspecified: Secondary | ICD-10-CM | POA: Insufficient documentation

## 2023-11-23 LAB — COMPREHENSIVE METABOLIC PANEL
ALT: 9 U/L (ref 0–44)
AST: 17 U/L (ref 15–41)
Albumin: 3.9 g/dL (ref 3.5–5.0)
Alkaline Phosphatase: 156 U/L — ABNORMAL HIGH (ref 38–126)
Anion gap: 16 — ABNORMAL HIGH (ref 5–15)
BUN: 23 mg/dL (ref 8–23)
CO2: 21 mmol/L — ABNORMAL LOW (ref 22–32)
Calcium: 9.4 mg/dL (ref 8.9–10.3)
Chloride: 100 mmol/L (ref 98–111)
Creatinine, Ser: 1.38 mg/dL — ABNORMAL HIGH (ref 0.61–1.24)
GFR, Estimated: 54 mL/min — ABNORMAL LOW (ref >60)
Glucose, Bld: 175 mg/dL — ABNORMAL HIGH (ref 70–99)
Potassium: 4.5 mmol/L (ref 3.5–5.1)
Sodium: 137 mmol/L (ref 135–145)
Total Bilirubin: 1.3 mg/dL — ABNORMAL HIGH (ref 0.0–1.2)
Total Protein: 7.9 g/dL (ref 6.5–8.1)

## 2023-11-23 LAB — CBC
HCT: 52.7 % — ABNORMAL HIGH (ref 39.0–52.0)
Hemoglobin: 16.8 g/dL (ref 13.0–17.0)
MCH: 29 pg (ref 26.0–34.0)
MCHC: 31.9 g/dL (ref 30.0–36.0)
MCV: 90.9 fL (ref 80.0–100.0)
Platelets: 365 10*3/uL (ref 150–400)
RBC: 5.8 MIL/uL (ref 4.22–5.81)
RDW: 15.1 % (ref 11.5–15.5)
WBC: 10.6 10*3/uL — ABNORMAL HIGH (ref 4.0–10.5)
nRBC: 0 % (ref 0.0–0.2)

## 2023-11-23 LAB — LIPASE, BLOOD: Lipase: 22 U/L (ref 11–51)

## 2023-11-23 MED ORDER — MORPHINE SULFATE (PF) 4 MG/ML IV SOLN
4.0000 mg | Freq: Once | INTRAVENOUS | Status: AC
  Administered 2023-11-23: 4 mg via INTRAVENOUS
  Filled 2023-11-23: qty 1

## 2023-11-23 MED ORDER — IOHEXOL 300 MG/ML  SOLN
100.0000 mL | Freq: Once | INTRAMUSCULAR | Status: AC | PRN
  Administered 2023-11-23: 100 mL via INTRAVENOUS

## 2023-11-23 MED ORDER — DROPERIDOL 2.5 MG/ML IJ SOLN
1.2500 mg | Freq: Once | INTRAMUSCULAR | Status: AC
  Administered 2023-11-23: 1.25 mg via INTRAVENOUS
  Filled 2023-11-23: qty 2

## 2023-11-23 MED ORDER — SODIUM CHLORIDE 0.9 % IV BOLUS
1000.0000 mL | Freq: Once | INTRAVENOUS | Status: AC
  Administered 2023-11-23: 1000 mL via INTRAVENOUS

## 2023-11-23 NOTE — ED Notes (Signed)
 Pt given cup of water to see if he can tolerate oral liquids.

## 2023-11-23 NOTE — ED Notes (Signed)
Pt updated on need for urine sample. Pt denies any need to go at this time. Will let me know when they are able to do so. Urinal provided at bedside  °

## 2023-11-23 NOTE — ED Notes (Signed)
 Pt tolerated po trial well. Pt denies any nausea or vomtting.

## 2023-11-23 NOTE — ED Triage Notes (Signed)
 Pt comes with vomiting since the last 6 weeks. Pt states he started back on ozempic and thinks that may be cause. Pt states he was here recently and dx with clot.  Pt states he hasn't been able to keep meds down.  Pt states pain all over in every joint. Pt states headache when he can't eat.

## 2023-11-23 NOTE — ED Provider Notes (Signed)
 [facility] Provider Note    Event Date/Time   First MD Initiated Contact with Patient 11/23/23 2033     (approximate)   History   Emesis   HPI Jeffery Beck is a 73 y.o. male with CAD s/p CABG, bipolar, HTN, HLD, CKD stage III, chronic pain syndrome, DM2 presenting today for vomiting.  Patient states over the past 2 days he has had intermittent nausea and vomiting.  He had some generalized abdominal pain yesterday and vomiting this morning.  He is states that over the past 5 hours he has not had any additional vomiting but is still having some mild nausea.  No specific abdominal pain at this time.  Otherwise denies fever, chills, chest pain, shortness of breath, congestion, diarrhea.  Intermittently has pain when urinating but no blood in urine. No pain today.  Chart review: Admission 1 month ago for intractable nausea and vomiting.  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  Vascular surgery recommended no anticoagulation at that time given no pain symptoms and safe for discharge.     Physical Exam   Triage Vital Signs: ED Triage Vitals  Encounter Vitals Group     BP 11/23/23 1524 (!) 133/106     Systolic BP Percentile --      Diastolic BP Percentile --      Pulse Rate 11/23/23 1524 (!) 110     Resp 11/23/23 1524 19     Temp 11/23/23 1524 98 F (36.7 C)     Temp src --      SpO2 11/23/23 1524 98 %     Weight 11/23/23 1523 190 lb (86.2 kg)     Height 11/23/23 1523 5\' 10"  (1.778 m)     Head Circumference --      Peak Flow --      Pain Score 11/23/23 1523 10     Pain Loc --      Pain Education --      Exclude from Growth Chart --     Most recent vital signs: Vitals:   11/23/23 2230 11/23/23 2300  BP: 120/67 120/67  Pulse: 90 91  Resp: 15 20  Temp:  98.1 F (36.7 C)  SpO2: 97% 92%   Physical Exam: I have reviewed the vital signs and nursing notes. General: Awake, alert, no acute distress.  Nontoxic  appearing. Head:  Atraumatic, normocephalic.   ENT:  EOM intact, PERRL. Oral mucosa is pink and moist with no lesions. Neck: Neck is supple with full range of motion, No meningeal signs. Cardiovascular:  RRR, No murmurs. Peripheral pulses palpable and equal bilaterally. Respiratory:  Symmetrical chest wall expansion.  No rhonchi, rales, or wheezes.  Good air movement throughout.  No use of accessory muscles.   Musculoskeletal:  No cyanosis or edema. Moving extremities with full ROM Abdomen:  Soft, nontender, nondistended. Neuro:  GCS 15, moving all four extremities, interacting appropriately. Speech clear. Psych:  Calm, appropriate.   Skin:  Warm, dry, no rash.    ED Results / Procedures / Treatments   Labs (all labs ordered are listed, but only abnormal results are displayed) Labs Reviewed  COMPREHENSIVE METABOLIC PANEL - Abnormal; Notable for the following components:      Result Value   CO2 21 (*)    Glucose, Bld 175 (*)    Creatinine, Ser 1.38 (*)    Alkaline Phosphatase 156 (*)    Total Bilirubin 1.3 (*)  GFR, Estimated 54 (*)    Anion gap 16 (*)    All other components within normal limits  CBC - Abnormal; Notable for the following components:   WBC 10.6 (*)    HCT 52.7 (*)    All other components within normal limits  LIPASE, BLOOD     EKG My EKG interpretation: Rate of 94, left bundle branch block.  Left axis deviation.  No other acute ST elevations or depressions   RADIOLOGY Independently interpreted CT abdomen/pelvis with no acute pathology   PROCEDURES:  Critical Care performed: No  Procedures   MEDICATIONS ORDERED IN ED: Medications  iohexol (OMNIPAQUE) 300 MG/ML solution 100 mL (100 mLs Intravenous Contrast Given 11/23/23 2101)  droperidol (INAPSINE) 2.5 MG/ML injection 1.25 mg (1.25 mg Intravenous Given 11/23/23 2125)  morphine (PF) 4 MG/ML injection 4 mg (4 mg Intravenous Given 11/23/23 2127)  sodium chloride 0.9 % bolus 1,000 mL (0 mLs  Intravenous Stopped 11/23/23 2230)     IMPRESSION / MDM / ASSESSMENT AND PLAN / ED COURSE  I reviewed the triage vital signs and the nursing notes.                              Differential diagnosis includes, but is not limited to, gastroenteritis, medication side effect, bowel obstruction, diverticulitis, acute cystitis  Patient's presentation is most consistent with acute complicated illness / injury requiring diagnostic workup.  Patient is a 73 year old male presenting today for nausea and vomiting.  Vital signs are stable with only slight tachycardia on arrival but otherwise asymptomatic.  Only having nausea symptoms at this time and physical exam reassuring with no specific abdominal pain anywhere.  CBC and lipase normal.  CMP consistent with his baseline although has slight anion gap elevation likely from dehydration.  Patient given 1 L fluids, droperidol, and morphine for symptomatic management.  CT abdomen/pelvis shows no acute pathology today.  He was reassessed and feeling better at this time.  Will p.o. challenge.  Patient tolerated p.o. without issue.  Vital signs remained stable.  Unable to give [place] urine but stated he was not as worried about it anymore.  Safe for discharge and outpatient follow-up with his PCP.  Given strict return precautions.  The patient is on the cardiac monitor to evaluate for evidence of arrhythmia and/or significant heart rate changes. Clinical Course as of 11/23/23 2317  Fri Nov 23, 2023  2038 Comprehensive metabolic panel(!) Creatinine consistent with baseline kidney function.  Slight anion gap acidosis but otherwise reassuring. [DW]  2252 Tolerating p.o. without issue [DW]    Clinical Course User Index [DW] Janith Lima, MD     FINAL CLINICAL IMPRESSION(S) / ED DIAGNOSES   Final diagnoses:  Nausea and vomiting, unspecified vomiting type     Rx / DC Orders   ED Discharge Orders     None        Note:  This document was prepared  using Dragon voice recognition software and may include unintentional dictation errors.   Janith Lima, MD 11/23/23 [phone]

## 2023-11-23 NOTE — ED Notes (Signed)
 Pt transported to CT ?

## 2023-11-23 NOTE — ED Notes (Signed)
 Provider at bedside

## 2023-11-23 NOTE — ED Notes (Signed)
 Pt attempted to urinate in urinal. Pt unable to go at this time. Urinal remains at bedside.

## 2023-11-23 NOTE — Discharge Instructions (Signed)
 Please follow-up with your primary care provider in the next couple of days for reassessment.  Please return for any severe worsening symptoms.  CT imaging of your abdomen was reassuring today.
